# Patient Record
Sex: Male | Born: 1970 | Race: Black or African American | Hispanic: No | Marital: Married | Smoking: Never smoker
Health system: Southern US, Community
[De-identification: ages and names within clinical notes are randomized; demographics above are authoritative.]

## PROBLEM LIST (undated history)

## (undated) DIAGNOSIS — E669 Obesity, unspecified: Secondary | ICD-10-CM

## (undated) DIAGNOSIS — K8020 Calculus of gallbladder without cholecystitis without obstruction: Secondary | ICD-10-CM

## (undated) DIAGNOSIS — E7800 Pure hypercholesterolemia, unspecified: Secondary | ICD-10-CM

## (undated) DIAGNOSIS — K219 Gastro-esophageal reflux disease without esophagitis: Secondary | ICD-10-CM

## (undated) DIAGNOSIS — I10 Essential (primary) hypertension: Secondary | ICD-10-CM

## (undated) DIAGNOSIS — E559 Vitamin D deficiency, unspecified: Secondary | ICD-10-CM

## (undated) DIAGNOSIS — E119 Type 2 diabetes mellitus without complications: Secondary | ICD-10-CM

## (undated) HISTORY — PX: COLONOSCOPY: SHX174

## (undated) HISTORY — PX: FINGER SURGERY: SHX640

---

## 1998-07-15 ENCOUNTER — Emergency Department (HOSPITAL_COMMUNITY): Admission: EM | Admit: 1998-07-15 | Discharge: 1998-07-15 | Payer: Self-pay | Admitting: Emergency Medicine

## 1998-07-15 ENCOUNTER — Ambulatory Visit (HOSPITAL_BASED_OUTPATIENT_CLINIC_OR_DEPARTMENT_OTHER): Admission: RE | Admit: 1998-07-15 | Discharge: 1998-07-15 | Payer: Self-pay | Admitting: Orthopedic Surgery

## 1998-07-15 ENCOUNTER — Encounter: Payer: Self-pay | Admitting: Emergency Medicine

## 2001-05-14 ENCOUNTER — Emergency Department (HOSPITAL_COMMUNITY): Admission: EM | Admit: 2001-05-14 | Discharge: 2001-05-14 | Payer: Self-pay | Admitting: Emergency Medicine

## 2004-06-02 ENCOUNTER — Emergency Department (HOSPITAL_COMMUNITY): Admission: EM | Admit: 2004-06-02 | Discharge: 2004-06-02 | Payer: Self-pay | Admitting: Emergency Medicine

## 2006-01-01 IMAGING — CT CT PELVIS W/O CM
1 series · 16 of 32 positions shown, 20 images · IV contrast (agent unspecified)
Comparison: No prior studies.

CLINICAL DATA: Right flank pain.
 CT ABDOMEN WITHOUT CONTRAST:
TECHNIQUE: Contiguous axial CT images were obtained from the adrenal glands through the iliac crests.  No contrast was administered.
TECHNIQUE: Contiguous axial CT images were obtained from the iliac crests through the proximal femurs.
 Pelvic phleboliths are present.  No definite distal ureteral calculi are noted.  The appendix is noted, for example, on image 60 of series 3 adjacent to the cecum and does not appear inflamed.  No dilated bowel is evident.  No free pelvic fluid.  No pelvic or inguinal adenopathy is identified.

[Series 3: stone_wo 5.0 b40f st · axial · 0.64mm/px · z∈[-616,-280]mm · 16 of 94 slices shown, 20 images]
[im 7/94  soft-tissue]
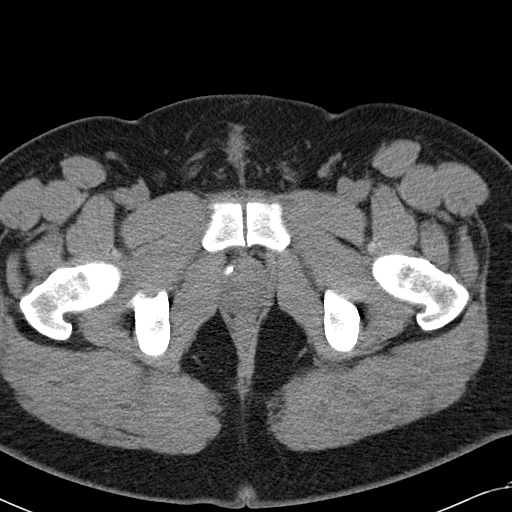
[im 7/94  bone]
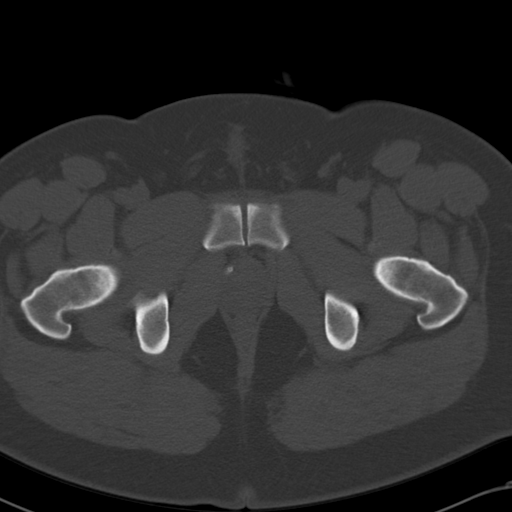
[im 13/94  soft-tissue]
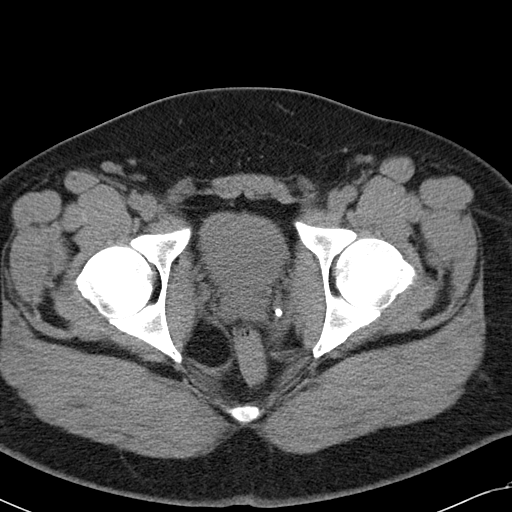
[im 19/94  soft-tissue]
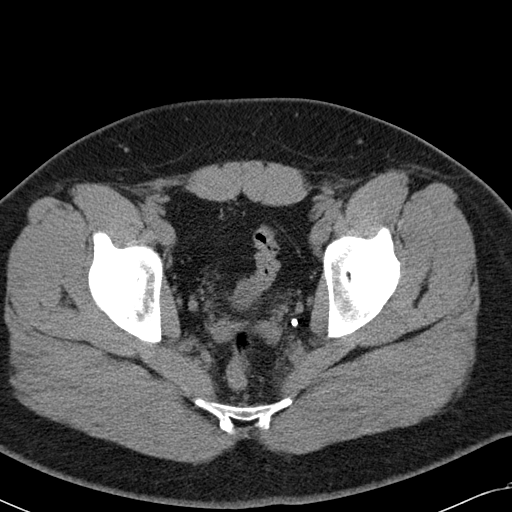
[im 25/94  soft-tissue]
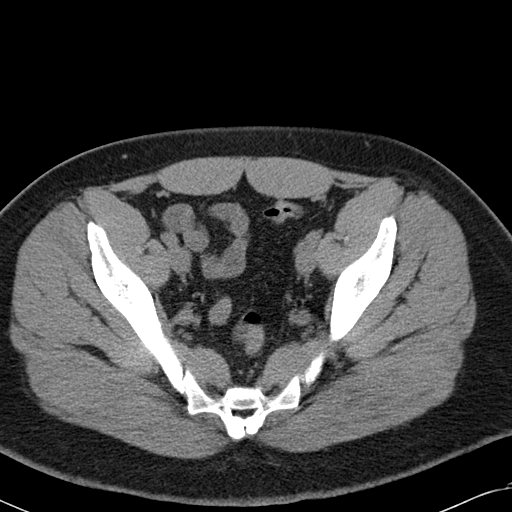
[im 31/94  soft-tissue]
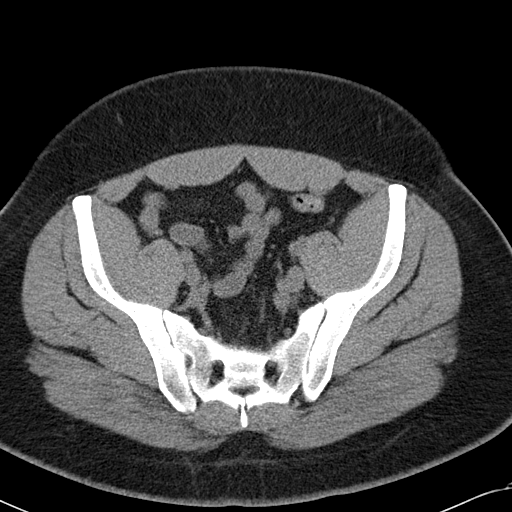
[im 37/94  soft-tissue]
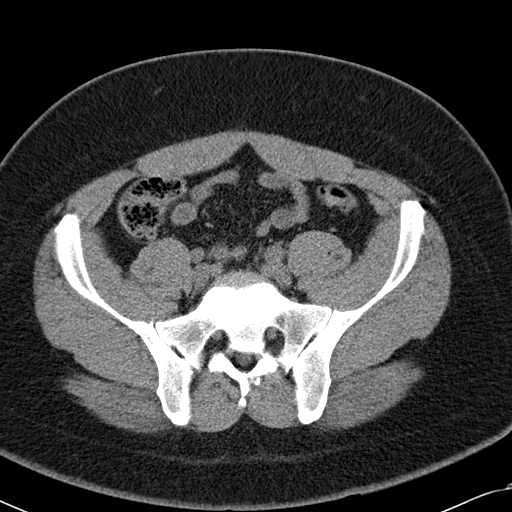
[im 43/94  soft-tissue]
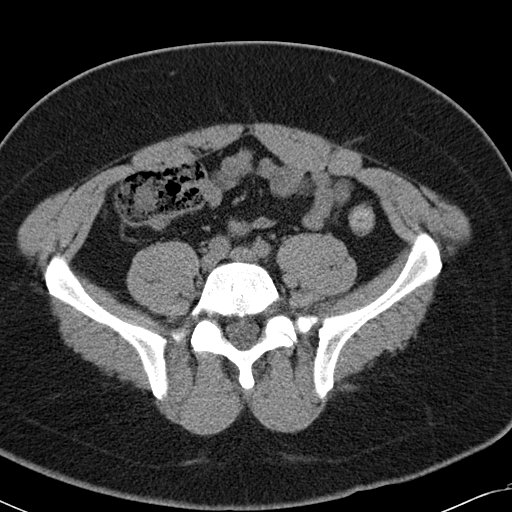
[im 52/94  soft-tissue]
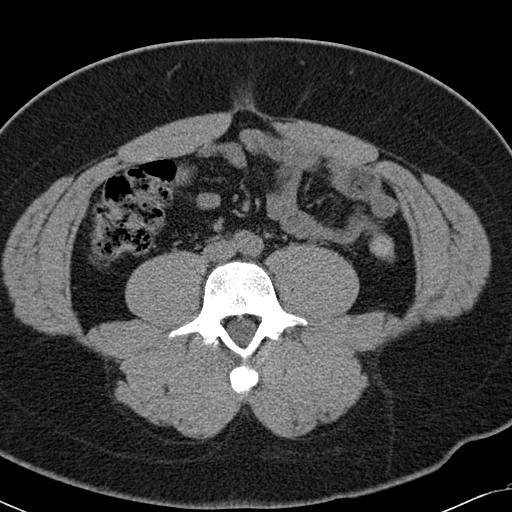
[im 58/94  soft-tissue]
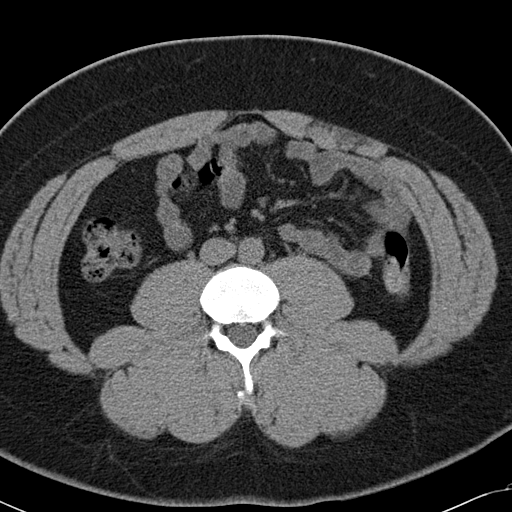
[im 58/94  bone]
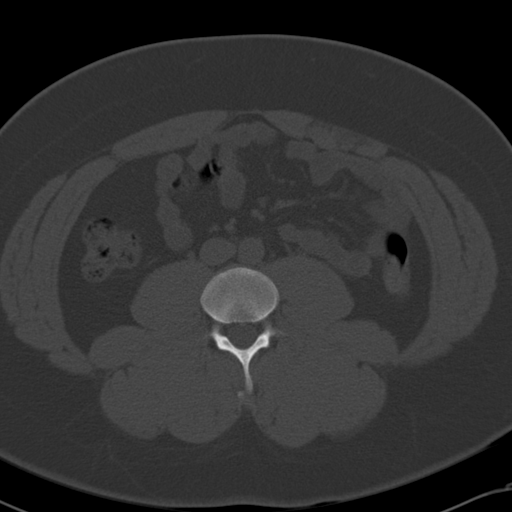
[im 64/94  soft-tissue]
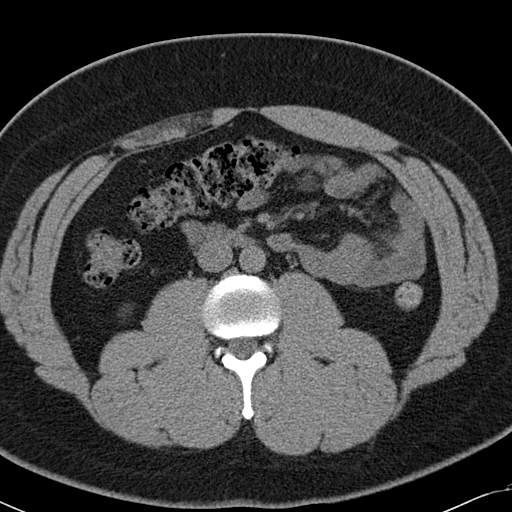
[im 70/94  soft-tissue]
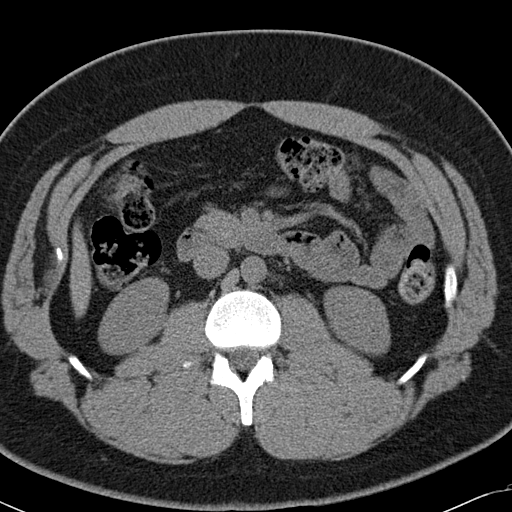
[im 76/94  soft-tissue]
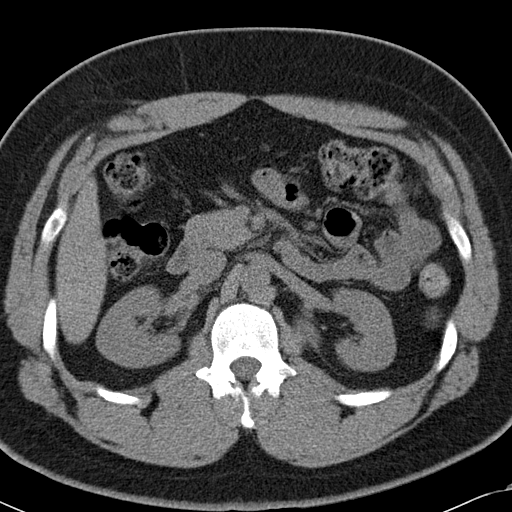
[im 82/94  soft-tissue]
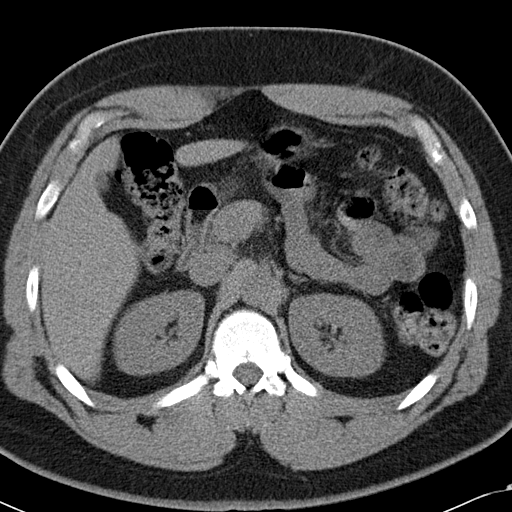
[im 82/94  lung]
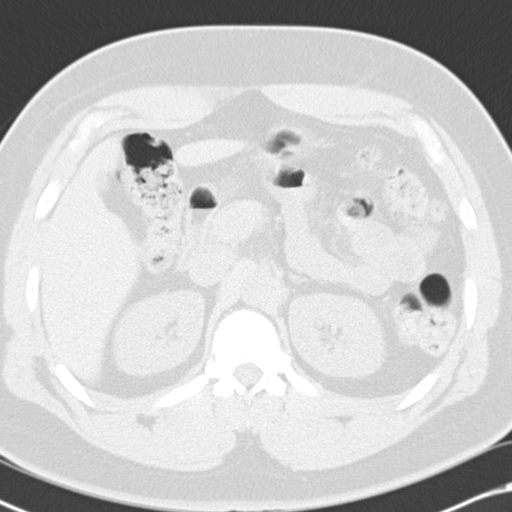
[im 85/94  lung]
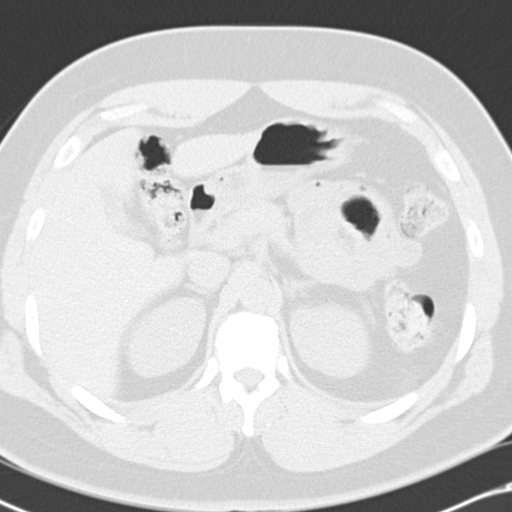
[im 88/94  soft-tissue]
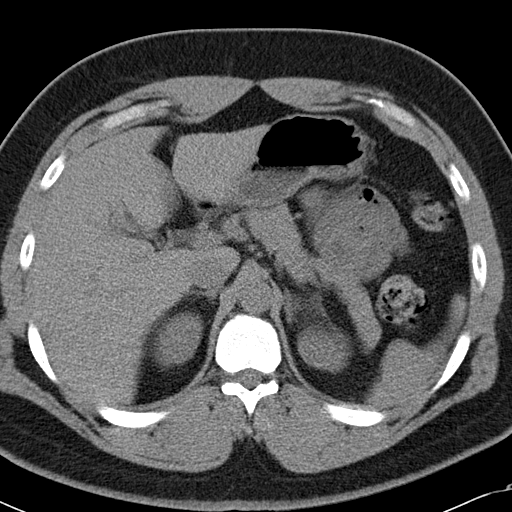
[im 88/94  lung]
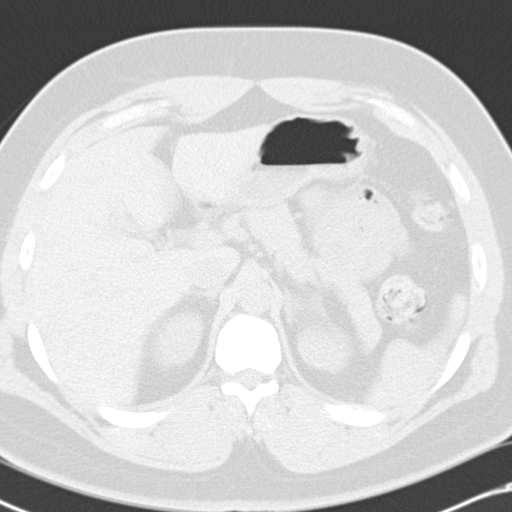
[im 91/94  lung]
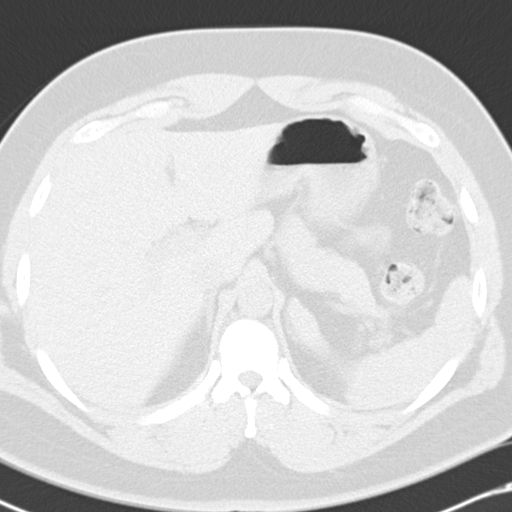

[16 of 32 positions shown; findings below may reference images not displayed]

FINDINGS: Dependent density in the gallbladder is nonspecific but could represent sludge or layering gallstones.  The adrenal glands appear unremarkable.  No renal calculi, hydronephrosis or hydroureter.  No pathologic retroperitoneal adenopathy.
IMPRESSION: No acute upper abdominal findings.  
 CT PELVIS WITHOUT CONTRAST:
IMPRESSION: No acute pelvic findings.

## 2015-08-22 ENCOUNTER — Ambulatory Visit
Admission: EM | Admit: 2015-08-22 | Discharge: 2015-08-22 | Disposition: A | Discharge status: Home / Self Care | Payer: BLUE CROSS/BLUE SHIELD | Attending: Family Medicine | Admitting: Family Medicine

## 2015-08-22 DIAGNOSIS — J309 Allergic rhinitis, unspecified: Secondary | ICD-10-CM | POA: Diagnosis not present

## 2015-08-22 DIAGNOSIS — J069 Acute upper respiratory infection, unspecified: Secondary | ICD-10-CM | POA: Diagnosis not present

## 2015-08-22 HISTORY — DX: Vitamin D deficiency, unspecified: E55.9

## 2015-08-22 LAB — RAPID STREP SCREEN (MED CTR MEBANE ONLY): Streptococcus, Group A Screen (Direct): NEGATIVE

## 2015-08-22 MED ORDER — FLUTICASONE PROPIONATE 50 MCG/ACT NA SUSP: 2.0000 | Freq: Every day | NASAL | 0 refills | Status: DC

## 2015-08-22 MED ORDER — FEXOFENADINE-PSEUDOEPHED ER 180-240 MG PO TB24: 1.0000 | ORAL_TABLET | Freq: Every day | ORAL | 0 refills | Status: DC

## 2015-08-22 NOTE — ED Triage Notes (Signed)
Patient states that he has sinus pain and pressure with dizziness and lightheaded. Patient states that symptoms started this morning when he woke up. Patient states that he went to work but, couldn't make it the whole day feeling bad.

## 2015-08-22 NOTE — ED Provider Notes (Signed)
[facility]    CSN: [id number] Arrival date & time: 08/22/15  1334  First Provider Contact:  First MD Initiated Contact with Patient 08/22/15 1518        History   Chief Complaint Chief Complaint  Patient presents with  . Sinusitis    HPI Charles Bell is a 45 y.o. male.   Patient is here for sinus infection. He woke up this morning felt dizzy and lightheaded. Which worked feel well and felt dizzy and congested. Comes in today will be evaluated for sinus infection. He does report having a sore throat as well. Unable to get the most consistent history from the patient this time as far as past history.    Sinusitis  Associated symptoms: congestion, rhinorrhea and sore throat   Associated symptoms: no ear pain, no fatigue and no fever     Past Medical History:  Diagnosis Date  . Vitamin D deficiency     There are no active problems to display for this patient.   Past Surgical History:  Procedure Laterality Date  . FINGER SURGERY Right    ring finger       Home Medications    Prior to Admission medications   Medication Sig Start Date End Date Taking? Authorizing Provider  fexofenadine-pseudoephedrine (ALLEGRA-D ALLERGY & CONGESTION) 180-240 MG 24 hr tablet Take 1 tablet by mouth daily. 08/22/15   Hassan Rowan, MD  fluticasone (FLONASE) 50 MCG/ACT nasal spray Place 2 sprays into both nostrils daily. 08/22/15   Hassan Rowan, MD    Family History History reviewed. No pertinent family history.  Social History Social History  Substance Use Topics  . Smoking status: Never Smoker  . Smokeless tobacco: Never Used  . Alcohol use No     Allergies   Review of patient's allergies indicates no known allergies.   Review of Systems Review of Systems  Constitutional: Positive for activity change. Negative for appetite change, fatigue and fever.  HENT: Positive for congestion, rhinorrhea, sinus pressure and sore throat. Negative for ear discharge, ear  pain and facial swelling.   Neurological: Positive for dizziness.  All other systems reviewed and are negative.    Physical Exam Triage Vital Signs ED Triage Vitals  Enc Vitals Group     BP 08/22/15 1416 (!) 143/72     Pulse Rate 08/22/15 1416 63     Resp 08/22/15 1416 16     Temp 08/22/15 1416 98.2 F (36.8 C)     Temp Source 08/22/15 1416 Oral     SpO2 08/22/15 1416 100 %     Weight 08/22/15 1416 256 lb (116.1 kg)     Height 08/22/15 1416  (1.778 m)     Head Circumference --      Peak Flow --      Pain Score 08/22/15 1415 4     Pain Loc --      Pain Edu? --      Excl. in GC? --    No data found.   Updated Vital Signs BP (!) 143/72 (BP Location: Left Arm)   Pulse 63   Temp 98.2 F (36.8 C) (Oral)   Resp 16   Ht  (1.778 m)   Wt 256 lb (116.1 kg)   SpO2 100%   BMI 36.73 kg/m   Visual Acuity Right Eye Distance:   Left Eye Distance:   Bilateral Distance:    Right Eye Near:   Left Eye Near:    Bilateral  Near:     Physical Exam  Constitutional: He is oriented to person, place, and time. He appears well-developed and well-nourished.  HENT:  Head: Normocephalic and atraumatic.  Right Ear: Tympanic membrane, external ear and ear canal normal.  Left Ear: Tympanic membrane, external ear and ear canal normal.  Nose: Mucosal edema present. No rhinorrhea, nose lacerations or sinus tenderness. Right sinus exhibits no maxillary sinus tenderness and no frontal sinus tenderness. Left sinus exhibits no maxillary sinus tenderness and no frontal sinus tenderness.  Mouth/Throat: No oral lesions. No dental caries. Posterior oropharyngeal erythema present.  Eyes: Conjunctivae are normal. Pupils are equal, round, and reactive to light.  Neck: Normal range of motion.  Cardiovascular: Normal rate and regular rhythm.   Pulmonary/Chest: Effort normal and breath sounds normal.  Musculoskeletal: Normal range of motion. He exhibits no deformity.  Neurological: He is alert  and oriented to person, place, and time.  Skin: Skin is warm and dry.  Psychiatric: He has a normal mood and affect.  Vitals reviewed.    UC Treatments / Results  Labs (all labs ordered are listed, but only abnormal results are displayed) Labs Reviewed  RAPID STREP SCREEN (NOT AT [facility])  CULTURE, GROUP A STREP [facility])    EKG  EKG Interpretation None       Radiology No results found.  Procedures Procedures (including critical care time)  Medications Ordered in UC Medications - No data to display   Initial Impression / Assessment and Plan / UC Course  I have reviewed the triage vital signs and the nursing notes.  Pertinent labs & imaging results that were available during my care of the patient were reviewed by me and considered in my medical decision making (see chart for details).  Clinical Course   Results for orders placed or performed during the hospital encounter of 08/22/15  Rapid strep screen  Result Value Ref Range   Streptococcus, Group A Screen (Direct) NEGATIVE NEGATIVE    At this time this appears be more viral or allergic sinusitis or URIp will place on Flonase and Allegra-D less strep test is positive. We have work note for today and tomorrow.  Final Clinical Impressions(s) / UC Diagnoses   Final diagnoses:  URI, acute  Allergic sinusitis    New Prescriptions New Prescriptions   FEXOFENADINE-PSEUDOEPHEDRINE (ALLEGRA-D ALLERGY & CONGESTION) 180-240 MG 24 HR TABLET    Take 1 tablet by mouth daily.   FLUTICASONE (FLONASE) 50 MCG/ACT NASAL SPRAY    Place 2 sprays into both nostrils daily.     Hassan Rowan, MD 08/22/15 [phone]

## 2015-08-26 LAB — CULTURE, GROUP A STREP (THRC)

## 2015-08-29 ENCOUNTER — Telehealth: Payer: Self-pay | Admitting: Emergency Medicine

## 2015-08-29 ENCOUNTER — Encounter: Payer: Self-pay | Admitting: Family Medicine

## 2015-08-29 MED ORDER — AMOXICILLIN 875 MG PO TABS: 875.0000 mg | ORAL_TABLET | Freq: Two times a day (BID) | ORAL | 0 refills | Status: AC

## 2015-08-29 NOTE — Telephone Encounter (Signed)
I have attempted twice to call patient and have left two messages on patient's home answering machine to call us back regarding his throat culture result.  Dr. Thurmond Butts has reviewed patient's chart and would like Amoxicillin 875mg  1 tablet twice a day dispense 20 with no refills sent to his pharmacy.  I will send the prescription to the pharmacy listed and will send a letter to his home notifying patient of his result and to pick up his prescription.

## 2015-09-02 ENCOUNTER — Telehealth: Payer: Self-pay

## 2015-09-02 NOTE — Telephone Encounter (Signed)
Informed patient of his test results for Strep- Negative

## 2015-10-15 ENCOUNTER — Ambulatory Visit (HOSPITAL_BASED_OUTPATIENT_CLINIC_OR_DEPARTMENT_OTHER): Payer: BLUE CROSS/BLUE SHIELD

## 2015-10-15 ENCOUNTER — Ambulatory Visit (INDEPENDENT_AMBULATORY_CARE_PROVIDER_SITE_OTHER): Payer: BLUE CROSS/BLUE SHIELD | Admitting: Podiatry

## 2015-10-15 ENCOUNTER — Encounter: Payer: Self-pay | Admitting: Podiatry

## 2015-10-15 DIAGNOSIS — M7672 Peroneal tendinitis, left leg: Secondary | ICD-10-CM | POA: Diagnosis not present

## 2015-10-15 DIAGNOSIS — R52 Pain, unspecified: Secondary | ICD-10-CM

## 2015-10-15 DIAGNOSIS — M7670 Peroneal tendinitis, unspecified leg: Secondary | ICD-10-CM | POA: Insufficient documentation

## 2015-10-15 MED ORDER — MELOXICAM 15 MG PO TABS: 15.0000 mg | ORAL_TABLET | Freq: Every day | ORAL | 2 refills | Status: DC

## 2015-10-15 NOTE — Patient Instructions (Signed)
Peroneal Tendinitis With Rehab Tendonitis is inflammation of a tendon. Inflammation of the tendons on the back of the outer ankle (peroneal tendons) is known as peroneal tendonitis. The peroneal tendons are responsible for connecting the muscles that allow you to stand on your tiptoes to the bones of the ankle. For this reason, peroneal tendonitis often causes pain when trying to complete such motions. Peroneal tendonitis often involves a tear (strain) of the peroneal tendons. Strains are classified into three categories. Grade 1 strains cause pain, but the tendon is not lengthened. Grade 2 strains include a lengthened ligament, due to the ligament being stretched or partially ruptured. With grade 2 strains there is still function, although function may be decreased. Grade 3 strains involve a complete tear of the tendon or muscle, and function is usually impaired. SYMPTOMS   Pain, tenderness, swelling, warmth, or redness over the back of the outer side of the ankle, the outer part of the mid-foot, or the bottom of the arch.  Pain that gets worse with ankle motion (especially when pushing off or pushing down with the front of the foot), or when standing on the ball of the foot or pushing the foot outward.  Crackling sound (crepitation) when the tendon is moved or touched. CAUSES  Peroneal tendinitis occurs when injury to the peroneal tendons causes the body to respond with inflammation. Common causes of injury include:  An overuse injury, in which the groove behind the outer ankle (where the tendon is located) causes wear on the tendon.  A sudden stress placed on the tendon, such as from an increase in the intensity, frequency, or duration of training.  Direct hit (trauma) to the tendon.  Return to activity too soon after a previous ankle injury. RISK INCREASES WITH:  Sports that require sudden, repetitive pushing off of the foot, such as jumping or quick starts.  Kicking and running sports,  especially running down hills or long distances.  Poor strength and flexibility.  Previous injury to the foot, ankle, or leg. PREVENTION  Warm up and stretch properly before activity.  Allow for adequate recovery between workouts.  Maintain physical fitness:  Strength, flexibility, and endurance.  Cardiovascular fitness.  Complete rehabilitation after previous injury. PROGNOSIS  If treated properly, peroneal tendonitis usually heals within 6 weeks.  RELATED COMPLICATIONS  Longer healing time, if not properly treated or if not given enough time to heal.  Recurring symptoms if activity is resumed too soon, with overuse, or when using poor technique.  If untreated, tendinitis may result in tendon rupture, requiring surgery. TREATMENT  Treatment first involves the use of ice and medicine to reduce pain and inflammation. The use of strengthening and stretching exercises may help reduce pain with activity. These exercises may be performed at unsuccessful, surgery to remove the inflamed tendon lining (sheath) may be advised.  MEDICATION   If pain medicine is needed, nonsteroidal anti-inflammatory medicines (aspirin and ibuprofen), or other minor pain relievers (acetaminophen), are often advised.  Do not take pain medicine for 7 days before surgery.  Prescription pain relievers may be given, if your caregiver thinks they are needed. Use only as directed and only as much as you need. HEAT AND COLD  Cold treatment (icing) should be applied for 10 to 15 minutes every 2 to 3 hours for inflammation and pain, and immediately after activity that aggravates your symptoms. Use ice packs or an ice massage.  Heat treatment may be used before performing stretching and strengthening activities prescribed by your   caregiver, physical therapist, or athletic trainer. Use a heat pack or a warm water soak. SEEK MEDICAL CARE IF:  Symptoms get worse or do not improve in 2 to 4 weeks, despite  treatment.  New, unexplained symptoms develop. (Drugs used in treatment may produce side effects.) EXERCISES RANGE OF MOTION (ROM) AND STRETCHING EXERCISES - Peroneal Tendinitis These exercises may help you when beginning to rehabilitate your injury. Your symptoms may resolve with or without further involvement from your physician, physical therapist or athletic trainer. While completing these exercises, remember:   Restoring tissue flexibility helps normal motion to return to the joints. This allows healthier, less painful movement and activity.  An effective stretch should be held for at least 30 seconds.  A stretch should never be painful. You should only feel a gentle lengthening or release in the stretched tissue. RANGE OF MOTION - Ankle Eversion  Sit with your right / left ankle crossed over your opposite knee.  Grip your foot with your opposite hand, placing your thumb on the top of your foot and your fingers across the bottom of your foot.  Gently push your foot downward with a slight rotation, so your littlest toes rise slightly toward the ceiling.  You should feel a gentle stretch on the inside of your ankle. Hold the stretch for __________ seconds. Repeat __________ times. Complete this exercise __________ times per day.  RANGE OF MOTION - Ankle Inversion  Sit with your right / left ankle crossed over your opposite knee.  Grip your foot with your opposite hand, placing your thumb on the bottom of your foot and your fingers across the top of your foot.  Gently pull your foot so the smallest toe comes toward you and your thumb pushes the inside of the ball of your foot away from you.  You should feel a gentle stretch on the outside of your ankle. Hold the stretch for __________ seconds. Repeat __________ times. Complete this exercise __________ times per day.  RANGE OF MOTION - Ankle Plantar Flexion  Sit with your right / left leg crossed over your opposite knee.  Use  your opposite hand to pull the top of your foot and toes toward you.  You should feel a gentle stretch on the top of your foot and ankle. Hold this position for __________ seconds. Repeat __________ times. Complete __________ times per day.  STRETCH - Gastroc, Standing  Place your hands on a wall.  Extend your right / left leg behind you, keeping the front knee somewhat bent.  Slightly point your toes inward on your back foot.  Keeping your right / left heel on the floor and your knee straight, shift your weight toward the wall, not allowing your back to arch.  You should feel a gentle stretch in the calf. Hold this position for __________ seconds. Repeat __________ times. Complete this stretch __________ times per day. STRETCH - Soleus, Standing  Place your hands on a wall.  Extend your right / left leg behind you, keeping the other knee somewhat bent.  Slightly point your toes inward on your back foot.  Keep your heel on the floor, bend your back knee, and slightly shift your weight over the back leg so that you feel a gentle stretch deep in your back calf.  Hold this position for __________ seconds. Repeat __________ times. Complete this stretch __________ times per day. STRETCH - Gastrocsoleus, Standing Note: This exercise can place a lot of stress on your foot and ankle. Please   complete this exercise only if specifically instructed by your caregiver.   Place the ball of your right / left foot on a step, keeping your other foot firmly on the same step.  Hold on to the wall or a rail for balance.  Slowly lift your other foot, allowing your body weight to press your heel down over the edge of the step.  You should feel a stretch in your right / left calf.  Hold this position for __________ seconds.  Repeat this exercise with a slight bend in your knee. Repeat __________ times. Complete this stretch __________ times per day.  STRENGTHENING EXERCISES - Peroneal Tendinitis   These exercises may help you when beginning to rehabilitate your injury. They may resolve your symptoms with or without further involvement from your physician, physical therapist or athletic trainer. While completing these exercises, remember:   Muscles can gain both the endurance and the strength needed for everyday activities through controlled exercises.  Complete these exercises as instructed by your physician, physical therapist or athletic trainer. Increase the resistance and repetitions only as guided by your caregiver. STRENGTH - Dorsiflexors  Secure a rubber exercise band or tubing to a fixed object (table, pole) and loop the other end around your right / left foot.  Sit on the floor facing the fixed object. The band should be slightly tense when your foot is relaxed.  Slowly draw your foot back toward you, using your ankle and toes.  Hold this position for __________ seconds. Slowly release the tension in the band and return your foot to the starting position. Repeat __________ times. Complete this exercise __________ times per day.  STRENGTH - Towel Curls  Sit in a chair, on a non-carpeted surface.  Place your foot on a towel, keeping your heel on the floor.  Pull the towel toward your heel only by curling your toes. Keep your heel on the floor.  If instructed by your physician, physical therapist or athletic trainer, add weight to the end of the towel. Repeat __________ times. Complete this exercise __________ times per day. STRENGTH - Ankle Eversion   Secure one end of a rubber exercise band or tubing to a fixed object (table, pole). Loop the other end around your foot, just before your toes.  Place your fists between your knees. This will focus your strengthening at your ankle.  Drawing the band across your opposite foot, away from the pole, slowly, pull your little toe out and up. Make sure the band is positioned to resist the entire motion.  Hold this position for  __________ seconds.  Have your muscles resist the band, as it slowly pulls your foot back to the starting position. Repeat __________ times. Complete this exercise __________ times per day.    This information is not intended to replace advice given to you by your health care provider. Make sure you discuss any questions you have with your health care provider.   Document Released: 01/12/2005 Document Revised: 05/29/2014 Document Reviewed: 04/26/2008 Elsevier Interactive Patient Education 2016 [employer]  

## 2015-10-15 NOTE — Progress Notes (Signed)
   Subjective:    Patient ID: Charles Bell, male    DOB: 1970-04-30, 45 y.o.   MRN: [id number]  HPI  45 year old male presents the office today for concerns of pain to the also asked for the left which been ongoing for about 3 months. He states the pain is intermittent. He does get pain after he stands for 11 hours on concrete floors at work and if he sits down through time and stands back up he gets pain in the outside aspect a points the fifth metatarsal base. He denies any recent injury or trauma. Describes as a throbbing, aching sensation. It does cause him to limp when he does have pain. Denies any swelling or redness. No recent treatment. No other complaints at this time.  Review of Systems  HENT: Positive for sinus pressure.   All other systems reviewed and are negative.      Objective:   Physical Exam General: AAO x3, NAD  Dermatological: Skin is warm, dry and supple bilateral. Nails x 10 are well manicured; remaining integument appears unremarkable at this time. There are no open sores, no preulcerative lesions, no rash or signs of infection present.  Vascular: Dorsalis Pedis artery and Posterior Tibial artery pedal pulses are 2/4 bilateral with immedate capillary fill time. There is no pain with calf compression, swelling, warmth, erythema.   Neruologic: Grossly intact via light touch bilateral. Vibratory intact via tuning fork bilateral. Protective threshold with Semmes Wienstein monofilament intact to all pedal sites bilateral.   Musculoskeletal: There is tenderness palpation to left fifth metatarsal base and along the distal portion of the peroneal tendons on the insertion. Tendons appear to be intact. There is no amount edema, erythema, increase in warmth. There is no area pinpoint bony tenderness or pain the vibratory sensation. MMT 5/5. Range of motion intact.  Gait: Unassisted, Nonantalgic.      Assessment & Plan:  45 year old male with left insertional peroneal  tendinitis -Treatment options discussed including all alternatives, risks, and complications -Etiology of symptoms were discussed -Patient declined x-rays -Discussed steroid injection which she wishes to proceed with periodic sterile conditions a mixture Dexon is an appointment seconds infiltrated around the area of maximal tenderness. Care was taken not to directly infiltrated into the peroneal tendon. Tolerated swelling, occasions. Post injection care was discussed. -Plantar fascial brace applied from medial to lateral -Prescribed mobic. Discussed side effects of the medication and directed to stop if any are to occur and call the office.  -Discussed stretching and icing daily -Shoegear modifications and orthtoics -Follow-up as scheduled or sooner if any problems arise. In the meantime, encouraged to call the office with any questions, concerns, change in symptoms.   Ovid CurdMatthew Wagoner, DPM

## 2015-11-12 ENCOUNTER — Ambulatory Visit: Payer: BLUE CROSS/BLUE SHIELD | Admitting: Podiatry

## 2017-06-26 ENCOUNTER — Emergency Department: Payer: BLUE CROSS/BLUE SHIELD

## 2017-06-26 ENCOUNTER — Encounter: Payer: Self-pay | Admitting: Emergency Medicine

## 2017-06-26 ENCOUNTER — Emergency Department
Admission: EM | Admit: 2017-06-26 | Discharge: 2017-06-26 | Disposition: A | Discharge status: Home / Self Care | Payer: BLUE CROSS/BLUE SHIELD | Attending: Emergency Medicine | Admitting: Emergency Medicine

## 2017-06-26 ENCOUNTER — Other Ambulatory Visit: Payer: Self-pay

## 2017-06-26 DIAGNOSIS — K8020 Calculus of gallbladder without cholecystitis without obstruction: Secondary | ICD-10-CM | POA: Diagnosis not present

## 2017-06-26 DIAGNOSIS — R1010 Upper abdominal pain, unspecified: Secondary | ICD-10-CM

## 2017-06-26 DIAGNOSIS — K8050 Calculus of bile duct without cholangitis or cholecystitis without obstruction: Secondary | ICD-10-CM

## 2017-06-26 LAB — COMPREHENSIVE METABOLIC PANEL
ALT: 34 U/L (ref 17–63)
ANION GAP: 12 (ref 5–15)
AST: 39 U/L (ref 15–41)
Albumin: 4.2 g/dL (ref 3.5–5.0)
Alkaline Phosphatase: 67 U/L (ref 38–126)
BUN: 13 mg/dL (ref 6–20)
CHLORIDE: 102 mmol/L (ref 101–111)
CO2: 24 mmol/L (ref 22–32)
CREATININE: 1.07 mg/dL (ref 0.61–1.24)
Calcium: 9.7 mg/dL (ref 8.9–10.3)
Glucose, Bld: 123 mg/dL — ABNORMAL HIGH (ref 65–99)
POTASSIUM: 4.6 mmol/L (ref 3.5–5.1)
SODIUM: 138 mmol/L (ref 135–145)
Total Bilirubin: 0.5 mg/dL (ref 0.3–1.2)
Total Protein: 7.8 g/dL (ref 6.5–8.1)

## 2017-06-26 LAB — CBC
HCT: 46.7 % (ref 40.0–52.0)
HEMOGLOBIN: 16 g/dL (ref 13.0–18.0)
MCH: 29.4 pg (ref 26.0–34.0)
MCHC: 34.3 g/dL (ref 32.0–36.0)
MCV: 85.7 fL (ref 80.0–100.0)
PLATELETS: 251 10*3/uL (ref 150–440)
RBC: 5.45 MIL/uL (ref 4.40–5.90)
RDW: 13.8 % (ref 11.5–14.5)
WBC: 7.4 10*3/uL (ref 3.8–10.6)

## 2017-06-26 LAB — URINALYSIS, COMPLETE (UACMP) WITH MICROSCOPIC
BACTERIA UA: NONE SEEN
BILIRUBIN URINE: NEGATIVE
Glucose, UA: NEGATIVE mg/dL
HGB URINE DIPSTICK: NEGATIVE
KETONES UR: NEGATIVE mg/dL
LEUKOCYTES UA: NEGATIVE
NITRITE: NEGATIVE
PH: 6 (ref 5.0–8.0)
Protein, ur: NEGATIVE mg/dL
SPECIFIC GRAVITY, URINE: 1.03 (ref 1.005–1.030)
Squamous Epithelial / LPF: NONE SEEN (ref 0–5)

## 2017-06-26 LAB — LIPASE, BLOOD: LIPASE: 31 U/L (ref 11–51)

## 2017-06-26 LAB — TROPONIN I: Troponin I: <0.03 ng/mL (ref <0.03)

## 2017-06-26 MED ORDER — IOPAMIDOL (ISOVUE-300) INJECTION 61%
100.0000 mL | Freq: Once | INTRAVENOUS | Status: AC | PRN
  Administered 2017-06-26: 100 mL via INTRAVENOUS

## 2017-06-26 MED ORDER — ONDANSETRON HCL 4 MG/2ML IJ SOLN
INTRAMUSCULAR | Status: AC
  Filled 2017-06-26: qty 2

## 2017-06-26 MED ORDER — HYDROMORPHONE HCL 1 MG/ML IJ SOLN
INTRAMUSCULAR | Status: AC
  Filled 2017-06-26: qty 1

## 2017-06-26 MED ORDER — MORPHINE SULFATE (PF) 4 MG/ML IV SOLN
4.0000 mg | Freq: Once | INTRAVENOUS | Status: DC
  Filled 2017-06-26: qty 1

## 2017-06-26 MED ORDER — SODIUM CHLORIDE 0.9 % IV BOLUS
1000.0000 mL | Freq: Once | INTRAVENOUS | Status: AC
  Administered 2017-06-26: 1000 mL via INTRAVENOUS

## 2017-06-26 MED ORDER — ONDANSETRON 4 MG PO TBDP: 4.0000 mg | ORAL_TABLET | Freq: Three times a day (TID) | ORAL | 0 refills | Status: DC | PRN

## 2017-06-26 MED ORDER — KETOROLAC TROMETHAMINE 30 MG/ML IJ SOLN
30.0000 mg | Freq: Once | INTRAMUSCULAR | Status: AC
  Administered 2017-06-26: 30 mg via INTRAVENOUS
  Filled 2017-06-26: qty 1

## 2017-06-26 MED ORDER — IOPAMIDOL (ISOVUE-300) INJECTION 61%
30.0000 mL | Freq: Once | INTRAVENOUS | Status: AC | PRN
  Administered 2017-06-26: 30 mL via ORAL

## 2017-06-26 MED ORDER — HYDROCODONE-ACETAMINOPHEN 5-325 MG PO TABS: 1.0000 | ORAL_TABLET | ORAL | 0 refills | Status: DC | PRN

## 2017-06-26 MED ORDER — ONDANSETRON HCL 4 MG/2ML IJ SOLN
4.0000 mg | Freq: Once | INTRAMUSCULAR | Status: AC
  Administered 2017-06-26: 4 mg via INTRAVENOUS
  Filled 2017-06-26: qty 2

## 2017-06-26 MED ORDER — FENTANYL CITRATE (PF) 100 MCG/2ML IJ SOLN
50.0000 ug | Freq: Once | INTRAMUSCULAR | Status: AC
  Administered 2017-06-26: 50 ug via INTRAVENOUS
  Filled 2017-06-26: qty 2

## 2017-06-26 NOTE — ED Triage Notes (Signed)
Epigastric pain x 1 hour. Nausea with emesis x 1. Denies fever.

## 2017-06-26 NOTE — ED Notes (Signed)
Patient transported to CT 

## 2017-06-26 NOTE — ED Provider Notes (Signed)
[facility] Emergency Department Provider Note  Time seen: 6:50 PM  I have reviewed the triage vital signs and the nursing notes.   HISTORY  Chief Complaint Abdominal Pain    HPI Charles Bell is a 47 y.o. male with no significant past medical history who presents to the emergency department for abdominal pain nausea vomiting.  According to the patient he woke this morning with upper and left-sided abdominal pain, nausea and has been vomiting.  Denies any diarrhea.  Denies any fever.  Largely negative review of systems otherwise.  Describes the pain is moderate dull aching type pain across the entire upper abdomen and left side.   Past Medical History:  Diagnosis Date  . Vitamin D deficiency     Patient Active Problem List   Diagnosis Date Noted  . Peroneal tendonitis 10/15/2015    Past Surgical History:  Procedure Laterality Date  . FINGER SURGERY Right    ring finger    Prior to Admission medications   Medication Sig Start Date End Date Taking? Authorizing Provider  fexofenadine-pseudoephedrine (ALLEGRA-D ALLERGY & CONGESTION) 180-240 MG 24 hr tablet Take 1 tablet by mouth daily. 08/22/15   Hassan Rowan, MD  fluticasone (FLONASE) 50 MCG/ACT nasal spray Place 2 sprays into both nostrils daily. 08/22/15   Hassan Rowan, MD  meloxicam (MOBIC) 15 MG tablet Take 1 tablet (15 mg total) by mouth daily. 10/15/15   Vivi Barrack, DPM    No Known Allergies  No family history on file.  Social History Social History   Tobacco Use  . Smoking status: Never Smoker  . Smokeless tobacco: Never Used  Substance Use Topics  . Alcohol use: No  . Drug use: No    Review of Systems Constitutional: Negative for fever. Eyes: Negative for visual complaints ENT: Negative for recent illness/congestion Cardiovascular: Negative for chest pain. Respiratory: Negative for shortness of breath. Gastrointestinal: Left-sided and upper abdominal pain.  Negative for  diarrhea.  Positive for nausea vomiting. Genitourinary: Negative for dysuria hematuria. Musculoskeletal: Negative for musculoskeletal complaints Skin: Negative for skin complaints  Neurological: Negative for headache All other ROS negative  ____________________________________________   PHYSICAL EXAM:  VITAL SIGNS: ED Triage Vitals  Enc Vitals Group     BP 06/26/17 1748 (!) 169/85     Pulse Rate 06/26/17 1748 (!) 56     Resp 06/26/17 1748 18     Temp 06/26/17 1748 98.1 F (36.7 C)     Temp Source 06/26/17 1748 Oral     SpO2 06/26/17 1748 100 %     Weight 06/26/17 1749 256 lb (116.1 kg)     Height 06/26/17 1749 5\' 10"  (1.778 m)     Head Circumference --      Peak Flow --      Pain Score --      Pain Loc --      Pain Edu? --      Excl. in GC? --     Constitutional: Alert and oriented. Well appearing and in no distress. Eyes: Normal exam ENT   Head: Normocephalic and atraumatic.   Mouth/Throat: Mucous membranes are moist. Cardiovascular: Normal rate, regular rhythm. No murmur Respiratory: Normal respiratory effort without tachypnea nor retractions. Breath sounds are clear Gastrointestinal: Soft, mild epigastric tenderness, moderate left upper quadrant left mid and left lower quadrant tenderness.  No rebound or guarding.  No distention. Musculoskeletal: Nontender with normal range of motion in all extremities.  Neurologic:  Normal speech and language.  No gross focal neurologic deficits Skin:  Skin is warm, dry and intact.  Psychiatric: Mood and affect are normal. Speech and behavior are normal.   ____________________________________________    EKG  EKG reviewed and interpreted by myself shows normal sinus rhythm at 53 bpm with a narrow QRS, normal axis, normal intervals, no concerning ST changes.  ____________________________________________    RADIOLOGY  CT scan shows no acute abnormalities, gallstones but no acute cholecystitis.  Ultrasound shows  gallstones but no signs of acute cholecystitis.  ____________________________________________   INITIAL IMPRESSION / ASSESSMENT AND PLAN / ED COURSE  Pertinent labs & imaging results that were available during my care of the patient were reviewed by me and considered in my medical decision making (see chart for details).  Patient presents to the emergency department for nausea vomiting and abdominal pain.  Differential would include gastritis, gastroenteritis, peptic or gastric ulcers, pancreatitis, colitis or diverticulitis.  We will obtain labs, CT scan, and treat nausea, IV hydrate.  Patient does not wish for any pain medication at this time.  CT scan pending, labs are resulted largely within normal limits, urinalysis pending.  Labs are largely within normal limits.  CT and ultrasound are consistent with gallstones but no acute cholecystitis, no other concerning findings.  Common bile duct is 6.5.  I discussed the findings with Dr. Earlene Plateravis of general surgery.  Recommends attempted pain control.  Patient had been refusing pain medication until now, remains nauseated continues with moderate to significant upper abdominal pain.  States mostly in the epigastrium at this time.  We will dose fentanyl and Toradol and reassess.   Patient states his pain is nearly gone rates it as a 1/10.  States he wishes to try to go home.  We will discharge with pain and nausea medication as well as general surgery follow-up.  Patient agreeable to plan of care. ____________________________________________   FINAL CLINICAL IMPRESSION(S) / ED DIAGNOSES  Upper abdominal pain Nausea vomiting    Minna AntisPaduchowski, Clide Remmers, MD 06/26/17 2235

## 2017-06-26 NOTE — ED Notes (Signed)
Pt refused IV Morphine at this time. Call bell in reach. Pt to call if pain reaches intolerable level.

## 2017-06-26 NOTE — Discharge Instructions (Addendum)
Please take your pain and nausea medication as needed, but only as prescribed.  Please call the number provided for general surgery follow-up on Monday morning to arrange a follow-up appointment as soon as possible.  Let them know you are seen in the emergency department.  Return to the emergency department for any acute worsening of pain or development of fever.

## 2017-06-26 NOTE — ED Notes (Addendum)
Call to CT; pt only able to drink 1 bottle - feels very full; EDP good with only one bottle, CT called for pt ready

## 2017-07-06 ENCOUNTER — Ambulatory Visit (INDEPENDENT_AMBULATORY_CARE_PROVIDER_SITE_OTHER): Payer: BLUE CROSS/BLUE SHIELD | Admitting: Surgery

## 2017-07-06 ENCOUNTER — Encounter: Payer: Self-pay | Admitting: Surgery

## 2017-07-06 VITALS — BP 149/78 | HR 77 | Temp 98.4°F | Ht 70.0 in | Wt 249.5 lb

## 2017-07-06 DIAGNOSIS — K8020 Calculus of gallbladder without cholecystitis without obstruction: Secondary | ICD-10-CM | POA: Diagnosis not present

## 2017-07-06 NOTE — Patient Instructions (Addendum)
Please give us a call in case you have any questions or concerns.   Cholelithiasis Cholelithiasis is also called "gallstones." It is a kind of gallbladder disease. The gallbladder is an organ that stores a liquid (bile) that helps you digest fat. Gallstones may not cause symptoms (may be silent gallstones) until they cause a blockage, and then they can cause pain (gallbladder attack). Follow these instructions at home:  Take over-the-counter and prescription medicines only as told by your doctor.  Stay at a healthy weight.  Eat healthy foods. This includes: ? Eating fewer fatty foods, like fried foods. ? Eating fewer refined carbs (refined carbohydrates). Refined carbs are breads and grains that are highly processed, like white bread and white rice. Instead, choose whole grains like whole-wheat bread and brown rice. ? Eating more fiber. Almonds, fresh fruit, and beans are healthy sources of fiber.  Keep all follow-up visits as told by your doctor. This is important. Contact a doctor if:  You have sudden pain in the upper right side of your belly (abdomen). Pain might spread to your right shoulder or your chest. This may be a sign of a gallbladder attack.  You feel sick to your stomach (are nauseous).  You throw up (vomit).  You have been diagnosed with gallstones that have no symptoms and you get: ? Belly pain. ? Discomfort, burning, or fullness in the upper part of your belly (indigestion). Get help right away if:  You have sudden pain in the upper right side of your belly, and it lasts for more than 2 hours.  You have belly pain that lasts for more than 5 hours.  You have a fever or chills.  You keep feeling sick to your stomach or you keep throwing up.  Your skin or the whites of your eyes turn yellow (jaundice).  You have dark-colored pee (urine).  You have light-colored poop (stool). Summary  Cholelithiasis is also called "gallstones."  The gallbladder is an organ  that stores a liquid (bile) that helps you digest fat.  Silent gallstones are gallstones that do not cause symptoms.  A gallbladder attack may cause sudden pain in the upper right side of your belly. Pain might spread to your right shoulder or your chest. If this happens, contact your doctor.  If you have sudden pain in the upper right side of your belly that lasts for more than 2 hours, get help right away. This information is not intended to replace advice given to you by your health care provider. Make sure you discuss any questions you have with your health care provider. Document Released: 07/01/2007 Document Revised: 09/29/2015 Document Reviewed: 09/29/2015 Elsevier Interactive Patient Education  2017 [employer]

## 2017-07-06 NOTE — Progress Notes (Signed)
Surgical Clinic History and Physical  Referring provider:  No referring provider defined for this encounter.  HISTORY OF PRESENT ILLNESS (HPI):  47 y.o. Bell presents for evaluation of recently diagnosed gallstones. Patient reports he 2 weeks ago developed severe epigastric abdominal pain around 8:30 am, 1 1/2 hours after awaking and before he'd eaten anything. He describes that he had eaten sushi between 11 pm - midnight the night prior and initially presented to urgent care facility for evaluation, who suspected his abdominal pain was secondary to food poisoning and prescribed anti-nausea medication. When patient's pain did not improve, he then presented to [facility] ED for further evaluation where CT and subsequent RUQ abdominal ultrasound were performed. Patient also says he prior to this episode was frequently eating fast food such as hamburgers and fried chicken for lunch while working and never previously experienced any abdominal pain -- RUQ, epigastric, or otherwise -- after eating such foods. He also denies any fever/chills, N/V, CP, or SOB. Since being told by [facility] ED that his pain could be attributable to his gallstones, he has stopped eating fast food and has been eating primarily seafood, which was what he ate prior to onset of his epigastric abdominal pain, with no further recurrence of his symptoms.  PAST MEDICAL HISTORY (PMH):  Past Medical History:  Diagnosis Date  . Vitamin D deficiency      PAST SURGICAL HISTORY (PSH):  Past Surgical History:  Procedure Laterality Date  . FINGER SURGERY Right    ring finger     MEDICATIONS:  Prior to Admission medications   Medication Sig Start Date End Date Taking? Authorizing Provider  diphenhydrAMINE (BENADRYL) 25 MG tablet Take 100 mg by mouth daily.   Yes [provider]  famotidine (PEPCID) 20 MG tablet Take 20 mg by mouth 2 (two) times daily.   Yes [provider]  fexofenadine-pseudoephedrine (ALLEGRA-D ALLERGY &  CONGESTION) 180-240 MG 24 hr tablet Take 1 tablet by mouth daily. 08/22/15  Yes Hassan Rowan, MD  fluticasone [facility]) 50 MCG/ACT nasal spray Place 2 sprays into both nostrils daily. 08/22/15  Yes Hassan Rowan, MD  meloxicam (MOBIC) 15 MG tablet Take 1 tablet (15 mg total) by mouth daily. 10/15/15  Yes Vivi Barrack, DPM  ondansetron (ZOFRAN ODT) 4 MG disintegrating tablet Take 1 tablet (4 mg total) by mouth every 8 (eight) hours as needed for nausea or vomiting. 06/26/17  Yes Minna Antis, MD  valACYclovir (VALTREX) 1000 MG tablet Take 1,000 mg by mouth daily as needed. 06/28/17  Yes [provider]     ALLERGIES:  No Known Allergies   SOCIAL HISTORY:  Social History   Socioeconomic History  . Marital status: Single    Spouse name: Not on file  . Number of children: Not on file  . Years of education: Not on file  . Highest education level: Not on file  Occupational History  . Not on file  Social Needs  . Financial resource strain: Not on file  . Food insecurity:    Worry: Not on file    Inability: Not on file  . Transportation needs:    Medical: Not on file    Non-medical: Not on file  Tobacco Use  . Smoking status: Never Smoker  . Smokeless tobacco: Never Used  Substance and Sexual Activity  . Alcohol use: No  . Drug use: No  . Sexual activity: Not on file  Lifestyle  . Physical activity:    Days per week: Not on  file    Minutes per session: Not on file  . Stress: Not on file  Relationships  . Social connections:    Talks on phone: Not on file    Gets together: Not on file    Attends religious service: Not on file    Active member of club or organization: Not on file    Attends meetings of clubs or organizations: Not on file    Relationship status: Not on file  . Intimate partner violence:    Fear of current or ex partner: Not on file    Emotionally abused: Not on file    Physically abused: Not on file    Forced sexual activity: Not on file  Other  Topics Concern  . Not on file  Social History Narrative  . Not on file    The patient currently resides (home / rehab facility / nursing home): Home The patient normally is (ambulatory / bedbound): Ambulatory  FAMILY HISTORY:  Family History  Problem Relation Age of Onset  . Cancer Maternal Aunt   . Prostate cancer Paternal Uncle   . Lung cancer Paternal Uncle   . Diabetes Paternal Uncle     Otherwise negative/non-contributory.  REVIEW OF SYSTEMS:  Constitutional: denies any other weight loss, fever, chills, or sweats  Eyes: denies any other vision changes, history of eye injury  ENT: denies sore throat, hearing problems  Respiratory: denies shortness of breath, wheezing  Cardiovascular: denies chest pain, palpitations  Gastrointestinal: abdominal pain, N/V, and bowel function as per HPI Musculoskeletal: denies any other joint pains or cramps  Skin: Denies any other rashes or skin discolorations Neurological: denies any other headache, dizziness, weakness  Psychiatric: Denies any other depression, anxiety   All other review of systems were otherwise negative   VITAL SIGNS:  BP (!) 149/78   Pulse 77   Temp 98.4 F (36.9 C) (Oral)   Ht 5\' 10"  (1.778 m)   Wt 249 lb 8 oz (113.2 kg)   BMI 35.80 kg/m   PHYSICAL EXAM:  Constitutional:  -- Obese body habitus  -- Awake, alert, and oriented x3  Eyes:  -- Pupils equally round and reactive to light  -- No scleral icterus  Ear, nose, throat:  -- No jugular venous distension -- No nasal drainage, bleeding Pulmonary:  -- No crackles  -- Equal breath sounds bilaterally -- Breathing non-labored at rest Cardiovascular:  -- S1, S2 present  -- No pericardial rubs  Gastrointestinal:  -- Abdomen soft, nontender, non-distended, no guarding/rebound  -- No abdominal masses appreciated, pulsatile or otherwise  Musculoskeletal and Integumentary:  -- Wounds or skin discoloration: None appreciated -- Extremities: B/L UE and LE  FROM, hands and feet warm  Neurologic:  -- Motor function: Intact and symmetric -- Sensation: Intact and symmetric  Labs:  CBC Latest Ref Rng & Units 06/26/2017  WBC 3.8 - 10.6 K/uL 7.4  Hemoglobin 13.0 - 18.0 g/dL 16.1  Hematocrit 09.6 - 52.0 % 46.7  Platelets 150 - 440 K/uL 251   CMP Latest Ref Rng & Units 06/26/2017  Glucose 65 - 99 mg/dL 045(W)  BUN 6 - 20 mg/dL 13  Creatinine 0.98 - 1.19 mg/dL 1.47  Sodium 829 - 562 mmol/L 138  Potassium 3.5 - 5.1 mmol/L 4.6  Chloride 101 - 111 mmol/L 102  CO2 22 - 32 mmol/L 24  Calcium 8.9 - 10.3 mg/dL 9.7  Total Protein 6.5 - 8.1 g/dL 7.8  Total Bilirubin 0.3 - 1.2 mg/dL 0.5  Alkaline  Phos 38 - 126 U/L 67  AST 15 - 41 U/L 39  ALT 17 - 63 U/L 34   Imaging studies:  Limited RUQ Abdominal Ultrasound (06/26/2017) Gallbladder is decompressed with multiple gallstones within. Common bile duct diameter measures 6.5 mm.  Assessment/Plan:  47 y.o. Bell with likely asymptomatic cholelithiasis and what seems to be a single likely unrelated episode of epigastric pain of unclear etiology, complicated by co-morbidities including obesity (BMI 36) and vitamin D deficiency.   - results of patient's imaging studies discussed in context of his symptoms  - heart healthy diet advised along with a log of foods associated with any future abdominal symptoms  - if develops post-prandial RUQ or epigastric abdominal pain after eating a particular type of food, discontinue that type of food (such as meats, cheeses/dairy, and fried foods as would be associated with cholelithiasis) and call office or present to ED  - currently no indication for surgical intervention at this time, instructed to call if any questions or concerns  - return to clinic as needed  All of the above recommendations were discussed with the patient, and all of patient's questions were answered to his expressed satisfaction.  Thank you for the opportunity to participate in this patient's  care.  -- Scherrie GerlachJason E. Earlene Plateravis, MD, RPVI : [facility] General Surgery - Partnering for exceptional care. Office: [phone]

## 2017-07-07 ENCOUNTER — Telehealth: Payer: Self-pay

## 2017-07-07 ENCOUNTER — Encounter: Payer: Self-pay | Admitting: Surgery

## 2017-07-07 NOTE — Telephone Encounter (Signed)
Spoke with Patients wife Charles Bell. She is very concerned that her husband was told he did not need to have his gall bladder removed. While in the emergency room she was told by the ER doctor that it needed to be removed due to the multiple gallstones he had.  She would like very much for Dr.Davis to call and speak with her personally as she finds the information that was given to her husband unacceptable.   I let her know that I would give Dr.Davis the message this afternoon as he is in clinic later today.  Charles Bell can be reached @ [phone].

## 2017-07-07 NOTE — Telephone Encounter (Signed)
Discussed with patient's wife, Herbert SetaHeather [phone]), the symptoms patient described yesterday, which prompted his abdominal CT and ultrasound [facility] ED, 6/1) that demonstrated cholelithiasis. Patient's wife confirmed patient's reported symptoms/experience, and the signs and symptoms of symptomatic cholelithiasis were also discussed along with indications for cholecystectomy. All of patient's wife's questions were answered clearly and concisely to her expressed satisfaction, and yesterday's recommendations for patient to record what foods appear to be associated with any future abdominal pain/symptoms and to follow up as needed were communicated. Patient's wife expressed understanding and satisfaction with the communicated recommendations.  -- Scherrie GerlachJason E. Earlene Plateravis, MD, RPVI [place]: [facility] General Surgery - Partnering for exceptional care. Office: [phone]

## 2019-01-25 IMAGING — US US ABDOMEN LIMITED
1 series · 14 of 25 positions shown · non-contrast
Comparison: CT from earlier in the same day.

CLINICAL DATA: Upper abdominal pain

EXAM:
ULTRASOUND ABDOMEN LIMITED RIGHT UPPER QUADRANT

[Series 1: us abdomen limited · 14 of 65 slices shown]
[im 1/65]
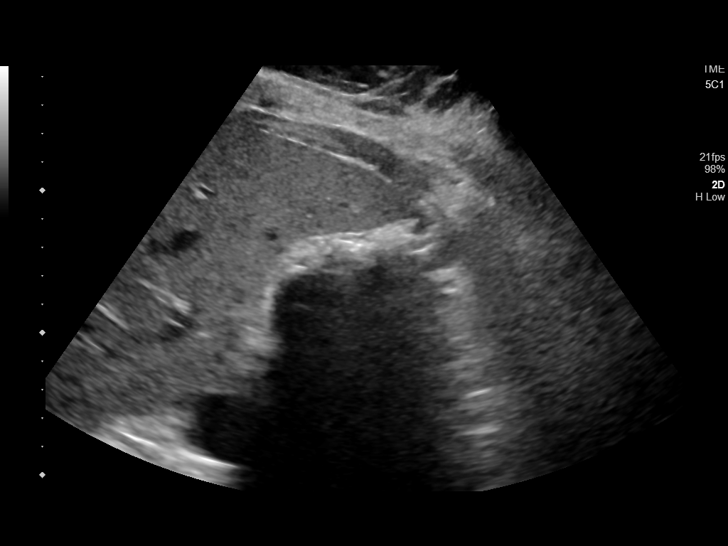
[im 6/65]
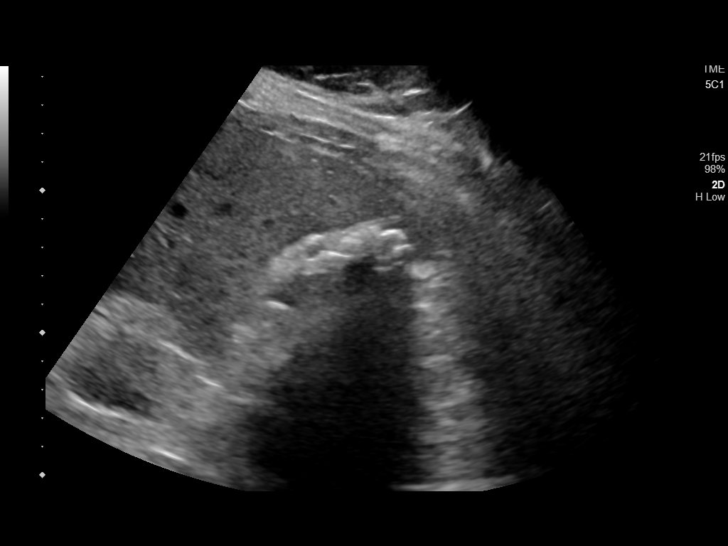
[im 11/65]
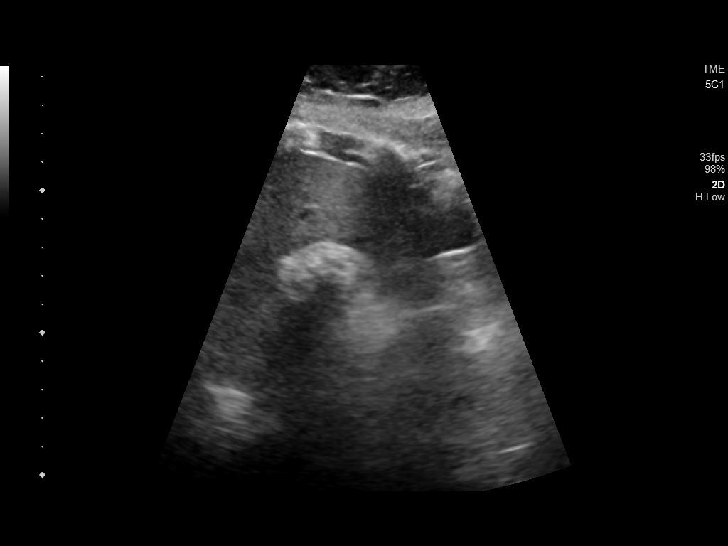
[im 17/65]
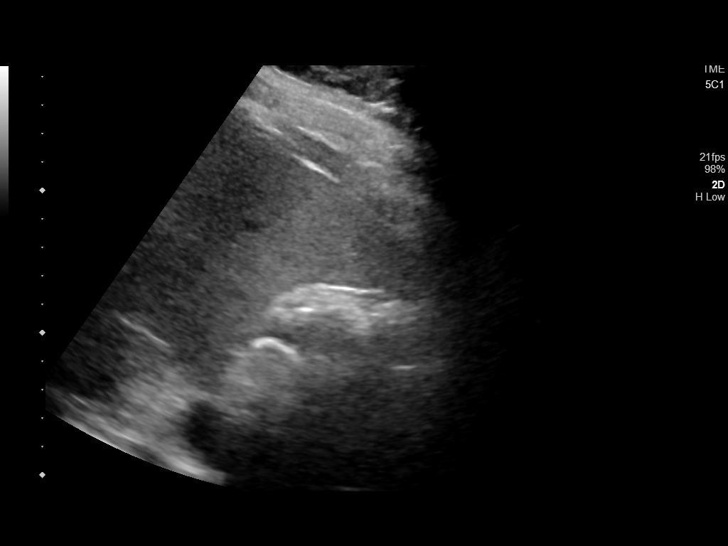
[im 22/65]
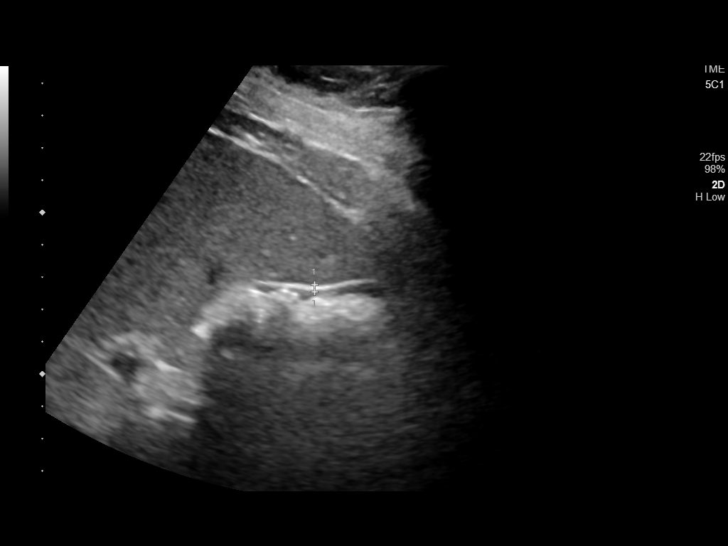
[im 25/65]
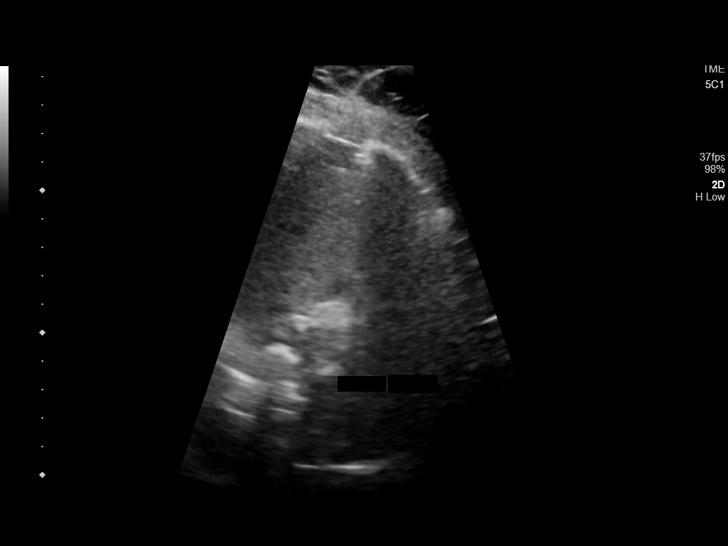
[im 30/65]
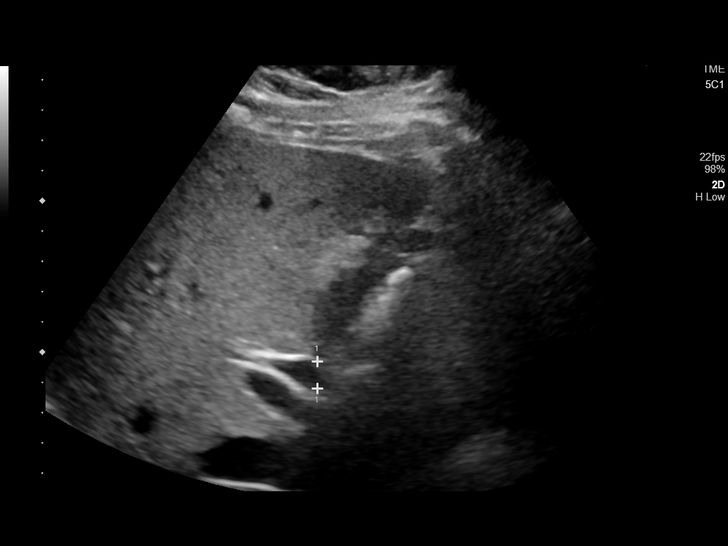
[im 35/65]
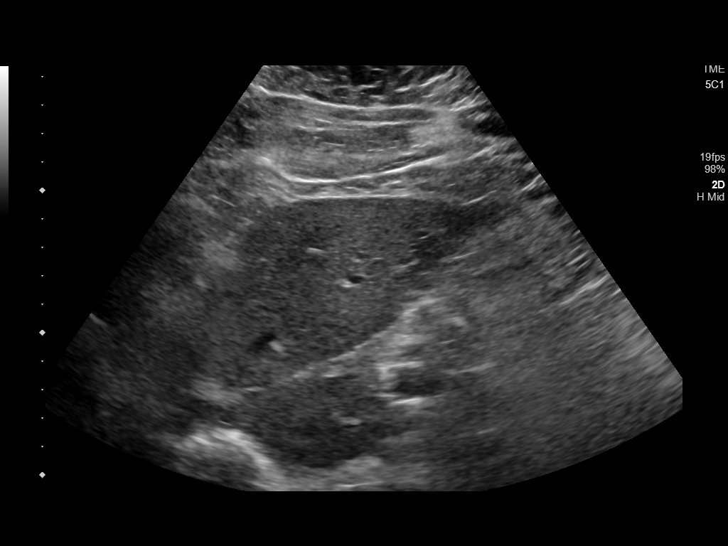
[im 41/65]
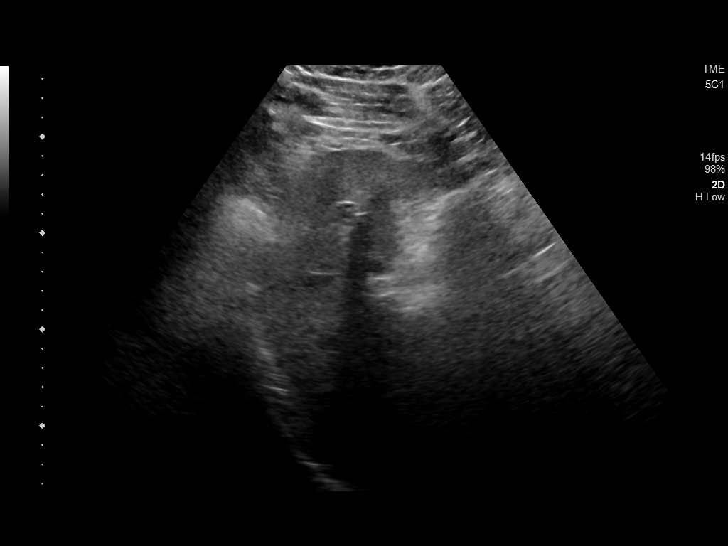
[im 43/65]
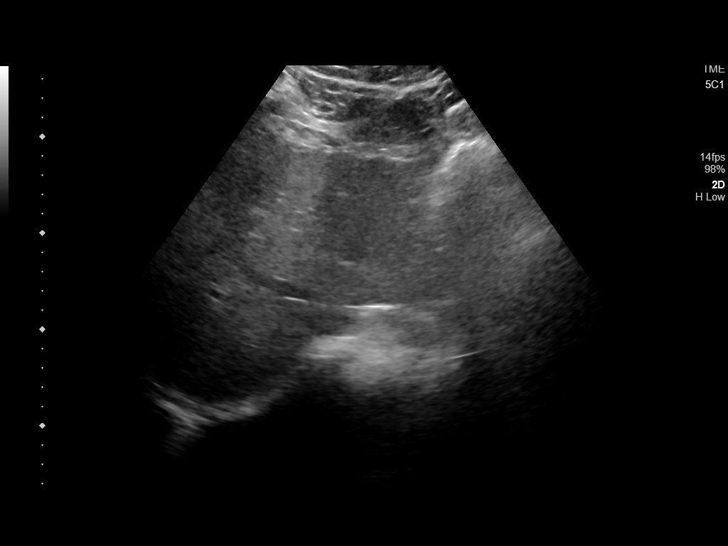
[im 49/65]
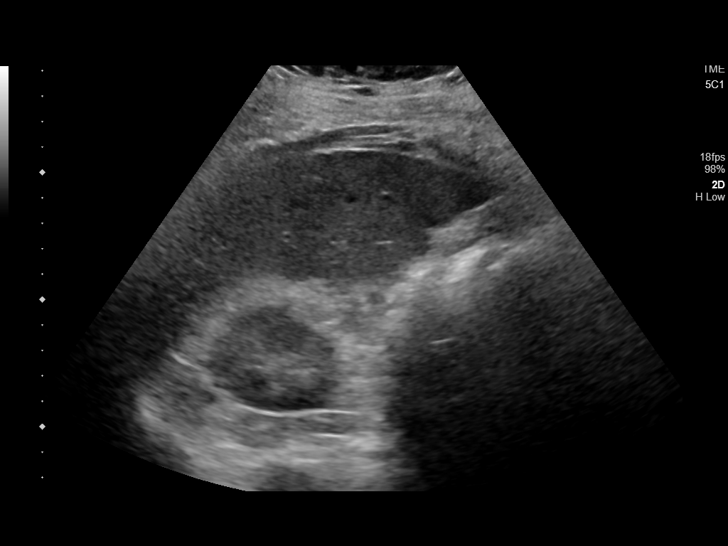
[im 54/65]
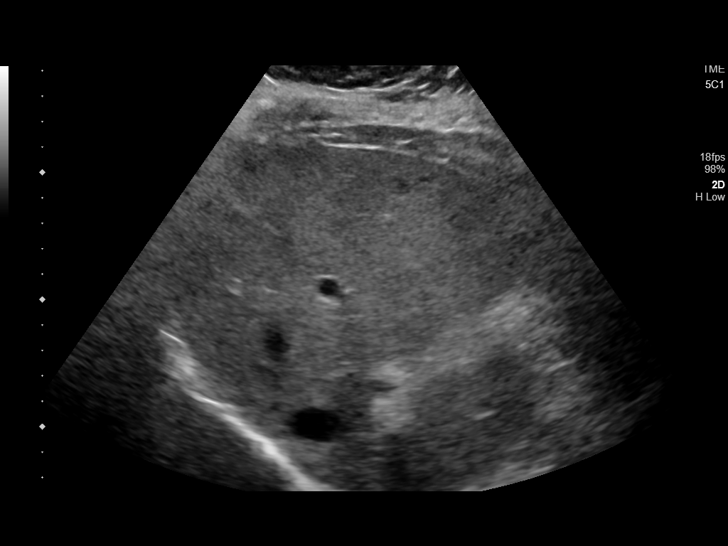
[im 59/65]
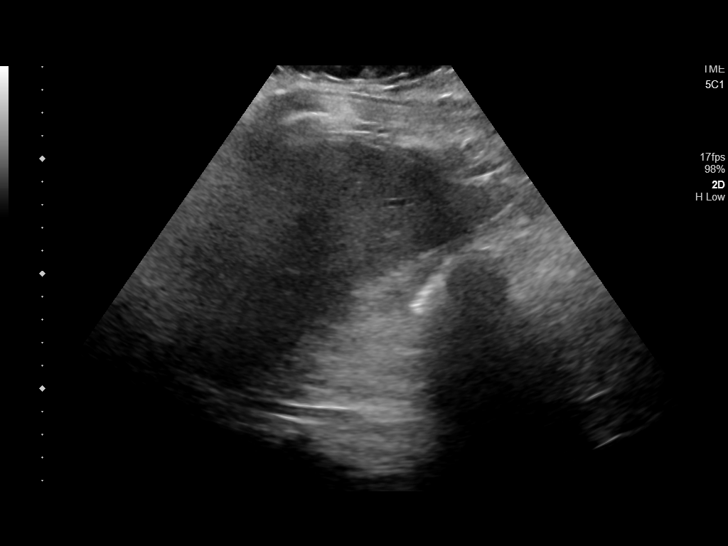
[im 65/65]
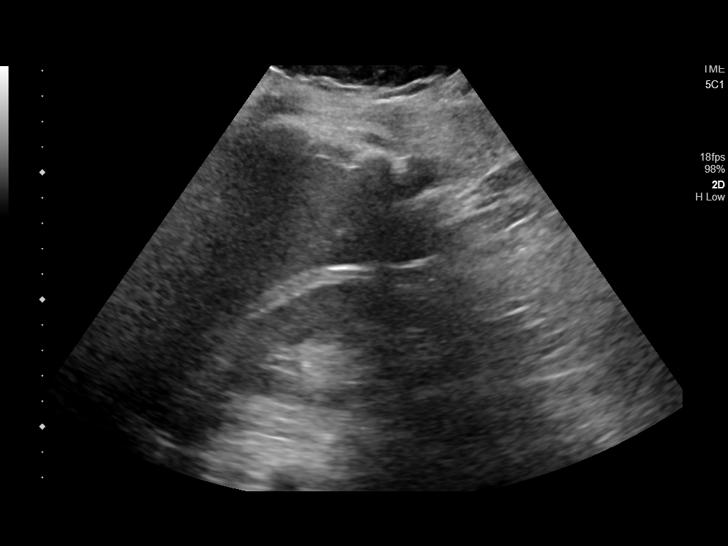

[14 of 25 positions shown; findings below may reference images not displayed]

FINDINGS: Gallbladder:

Gallbladder is decompressed with multiple gallstones within.

Common bile duct:

Diameter: 6.5 mm

Liver:

No focal lesion identified. Within normal limits in parenchymal
echogenicity. Portal vein is patent on color Doppler imaging with
normal direction of blood flow towards the liver.
IMPRESSION: Multiple gallstones without complicating factors.

## 2019-05-06 ENCOUNTER — Ambulatory Visit: Payer: BC Managed Care – PPO | Attending: Internal Medicine

## 2019-05-06 DIAGNOSIS — Z23 Encounter for immunization: Secondary | ICD-10-CM

## 2019-05-06 NOTE — Progress Notes (Signed)
   Covid-19 Vaccination Clinic  Name:  Charles Bell    MRN: [id number] DOB: 1970-07-13  05/06/2019  Charles Bell was observed post Covid-19 immunization for 15 minutes without incident. He was provided with Vaccine Information Sheet and instruction to access the V-Safe system.   Charles Bell was instructed to call 911 with any severe reactions post vaccine: Marland Kitchen Difficulty breathing  . Swelling of face and throat  . A fast heartbeat  . A bad rash all over body  . Dizziness and weakness   Immunizations Administered    Name Date Dose VIS Date Route   Pfizer COVID-19 Vaccine 05/06/2019 10:21 AM 0.3 mL 01/06/2019 Intramuscular   Manufacturer: [employer], [employer]   Lot: [id number]   NDC: [address]-1

## 2019-05-30 ENCOUNTER — Ambulatory Visit: Payer: BC Managed Care – PPO | Attending: Internal Medicine

## 2019-05-30 DIAGNOSIS — Z23 Encounter for immunization: Secondary | ICD-10-CM

## 2019-05-30 NOTE — Progress Notes (Signed)
   Covid-19 Vaccination Clinic  Name:  Charles Bell    MRN: [id number] DOB: 20-Nov-1970  05/30/2019  [name] was observed post Covid-19 immunization for 15 minutes without incident. He was provided with Vaccine Information Sheet and instruction to access the V-Safe system.   [name] was instructed to call 911 with any severe reactions post vaccine: Marland Kitchen Difficulty breathing  . Swelling of face and throat  . A fast heartbeat  . A bad rash all over body  . Dizziness and weakness   Immunizations Administered    Name Date Dose VIS Date Route   Pfizer COVID-19 Vaccine 05/30/2019 12:32 PM 0.3 mL 03/22/2018 Intramuscular   Manufacturer: [employer], [employer]   Lot: [id number]   NDC: [address]-4

## 2020-06-02 ENCOUNTER — Emergency Department
Admission: EM | Admit: 2020-06-02 | Discharge: 2020-06-02 | Disposition: A | Discharge status: Home / Self Care | Payer: BC Managed Care – PPO | Attending: Emergency Medicine | Admitting: Emergency Medicine

## 2020-06-02 ENCOUNTER — Emergency Department: Payer: BC Managed Care – PPO

## 2020-06-02 ENCOUNTER — Other Ambulatory Visit: Payer: Self-pay

## 2020-06-02 DIAGNOSIS — R109 Unspecified abdominal pain: Secondary | ICD-10-CM | POA: Diagnosis present

## 2020-06-02 DIAGNOSIS — R319 Hematuria, unspecified: Secondary | ICD-10-CM | POA: Diagnosis not present

## 2020-06-02 LAB — BASIC METABOLIC PANEL
Anion gap: 15 (ref 5–15)
BUN: 13 mg/dL (ref 6–20)
CO2: 20 mmol/L — ABNORMAL LOW (ref 22–32)
Calcium: 9.1 mg/dL (ref 8.9–10.3)
Chloride: 105 mmol/L (ref 98–111)
Creatinine, Ser: 1.14 mg/dL (ref 0.61–1.24)
GFR, Estimated: >60 mL/min (ref >60)
Glucose, Bld: 121 mg/dL — ABNORMAL HIGH (ref 70–99)
Potassium: 3.8 mmol/L (ref 3.5–5.1)
Sodium: 140 mmol/L (ref 135–145)

## 2020-06-02 LAB — URINALYSIS, COMPLETE (UACMP) WITH MICROSCOPIC
Bacteria, UA: NONE SEEN
Bilirubin Urine: NEGATIVE
Glucose, UA: NEGATIVE mg/dL
Ketones, ur: NEGATIVE mg/dL
Leukocytes,Ua: NEGATIVE
Nitrite: NEGATIVE
Protein, ur: NEGATIVE mg/dL
Specific Gravity, Urine: 1.025 (ref 1.005–1.030)
pH: 5 (ref 5.0–8.0)

## 2020-06-02 LAB — CBC
HCT: 46.9 % (ref 39.0–52.0)
Hemoglobin: 15.1 g/dL (ref 13.0–17.0)
MCH: 28.7 pg (ref 26.0–34.0)
MCHC: 32.2 g/dL (ref 30.0–36.0)
MCV: 89 fL (ref 80.0–100.0)
Platelets: 241 10*3/uL (ref 150–400)
RBC: 5.27 MIL/uL (ref 4.22–5.81)
RDW: 13.4 % (ref 11.5–15.5)
WBC: 7.1 10*3/uL (ref 4.0–10.5)
nRBC: 0 % (ref 0.0–0.2)

## 2020-06-02 MED ORDER — KETOROLAC TROMETHAMINE 60 MG/2ML IM SOLN
30.0000 mg | Freq: Once | INTRAMUSCULAR | Status: AC
  Administered 2020-06-02: 30 mg via INTRAMUSCULAR
  Filled 2020-06-02: qty 2

## 2020-06-02 MED ORDER — METHOCARBAMOL 750 MG PO TABS: 750.0000 mg | ORAL_TABLET | Freq: Four times a day (QID) | ORAL | 0 refills | Status: AC | PRN

## 2020-06-02 MED ORDER — ACETAMINOPHEN 325 MG PO TABS
650.0000 mg | ORAL_TABLET | Freq: Once | ORAL | Status: AC
  Administered 2020-06-02: 650 mg via ORAL
  Filled 2020-06-02: qty 2

## 2020-06-02 MED ORDER — KETOROLAC TROMETHAMINE 10 MG PO TABS: 10.0000 mg | ORAL_TABLET | Freq: Four times a day (QID) | ORAL | 0 refills | Status: AC | PRN

## 2020-06-02 MED ORDER — METHOCARBAMOL 500 MG PO TABS
750.0000 mg | ORAL_TABLET | Freq: Once | ORAL | Status: AC
  Administered 2020-06-02: 750 mg via ORAL
  Filled 2020-06-02: qty 2

## 2020-06-02 NOTE — ED Triage Notes (Signed)
Pt presents c/o right flank pain x1 week. Reports pain worsening with movement of pain in RLQ of abd. Reports ome nausea. Denies urinary symptoms.

## 2020-06-02 NOTE — Discharge Instructions (Addendum)
Please use the Toradol and Robaxin as prescribed.  You may also use Tylenol, up to 1000 mg 4 times daily as needed for pain.  Please follow-up with urology as listed.  Return to the emergency department if you experience any worsening of symptoms.

## 2020-06-04 NOTE — ED Provider Notes (Signed)
[facility] Emergency Department Provider Note   ____________________________________________   Event Date/Time   First MD Initiated Contact with Patient 06/02/20 1926     (approximate)  I have reviewed the triage vital signs and the nursing notes.   HISTORY  Chief Complaint Flank Pain   HPI Charles Bell is a 50 y.o. male who presents to the emergency department for evaluation of right flank pain over the last 1 week.  He reports that the pain is sometimes worse with movement, sometimes it is there all the time regardless of movement.  He does endorse some associated nausea.  He denies any urinary symptoms, states that today it began wrapping around from his flank into the right upper quadrant.  He denies any dysuria, hematuria or difficulty urinating.  Denies any fevers or other systemic symptoms.        Past Medical History:  Diagnosis Date  . Vitamin D deficiency     Patient Active Problem List   Diagnosis Date Noted  . Peroneal tendonitis 10/15/2015    Past Surgical History:  Procedure Laterality Date  . FINGER SURGERY Right    ring finger    Prior to Admission medications   Medication Sig Start Date End Date Taking? Authorizing Provider  ketorolac (TORADOL) 10 MG tablet Take 1 tablet (10 mg total) by mouth every 6 (six) hours as needed for up to 5 days. 06/02/20 06/07/20 Yes Rayfield Beem, Ruben Gottron, PA  methocarbamol (ROBAXIN-750) 750 MG tablet Take 1 tablet (750 mg total) by mouth 4 (four) times daily as needed for up to 10 days for muscle spasms. 06/02/20 06/12/20 Yes [place] Scheidegger, Ruben Gottron, PA  diphenhydrAMINE (BENADRYL) 25 MG tablet Take 100 mg by mouth daily.    [provider]  famotidine (PEPCID) 20 MG tablet Take 20 mg by mouth 2 (two) times daily.    [provider]  fexofenadine-pseudoephedrine (ALLEGRA-D ALLERGY & CONGESTION) 180-240 MG 24 hr tablet Take 1 tablet by mouth daily. 08/22/15   Hassan Rowan, MD  fluticasone  (FLONASE) 50 MCG/ACT nasal spray Place 2 sprays into both nostrils daily. 08/22/15   Hassan Rowan, MD  meloxicam (MOBIC) 15 MG tablet Take 1 tablet (15 mg total) by mouth daily. 10/15/15   Vivi Barrack, DPM  ondansetron (ZOFRAN ODT) 4 MG disintegrating tablet Take 1 tablet (4 mg total) by mouth every 8 (eight) hours as needed for nausea or vomiting. 06/26/17   Minna Antis, MD  valACYclovir (VALTREX) 1000 MG tablet Take 1,000 mg by mouth daily as needed. 06/28/17   [provider]    Allergies Patient has no known allergies.  Family History  Problem Relation Age of Onset  . Cancer Maternal Aunt   . Prostate cancer Paternal Uncle   . Lung cancer Paternal Uncle   . Diabetes Paternal Uncle     Social History Social History   Tobacco Use  . Smoking status: Never Smoker  . Smokeless tobacco: Never Used  Substance Use Topics  . Alcohol use: No  . Drug use: No    Review of Systems Constitutional: No fever/chills Eyes: No visual changes. ENT: No sore throat. Cardiovascular: Denies chest pain. Respiratory: Denies shortness of breath. Gastrointestinal: + Right upper quadrant abdominal pain.  + nausea, no vomiting.  No diarrhea.  No constipation. + Right flank pain Genitourinary: Negative for dysuria. Musculoskeletal: Negative for back pain. Skin: Negative for rash. Neurological: Negative for headaches, focal weakness or numbness.   ____________________________________________   PHYSICAL EXAM:  VITAL SIGNS: ED Triage Vitals  Enc Vitals Group     BP 06/02/20 1805 (!) 162/86     Pulse Rate 06/02/20 1805 83     Resp 06/02/20 1805 16     Temp 06/02/20 1805 98.1 F (36.7 C)     Temp Source 06/02/20 1805 Oral     SpO2 06/02/20 1805 99 %     Weight 06/02/20 1806 240 lb (108.9 kg)     Height --      Head Circumference --      Peak Flow --      Pain Score 06/02/20 1805 9     Pain Loc --      Pain Edu? --      Excl. in GC? --    Constitutional: Alert and  oriented. Well appearing and in no acute distress. Eyes: Conjunctivae are normal. PERRL. EOMI. Head: Atraumatic. Nose: No congestion/rhinnorhea. Mouth/Throat: Mucous membranes are moist.  Oropharynx non-erythematous. Neck: No stridor.   Cardiovascular: Normal rate, regular rhythm. Grossly normal heart sounds.  Good peripheral circulation. Respiratory: Normal respiratory effort.  No retractions. Lungs CTAB. Gastrointestinal: Soft and nontender.  No Murphy's sign.  No distention. No abdominal bruits. + Right CVA tenderness, negative for left CVA tenderness. Musculoskeletal: No midline tenderness of the lumbar spine, 5/5 strength in bilateral lower extremities in ankle plantarflexion, dorsiflexion, knee flexion and extension.  No lower extremity tenderness nor edema.  No joint effusions. Neurologic:  Normal speech and language. No gross focal neurologic deficits are appreciated. No gait instability. Skin:  Skin is warm, dry and intact. No rash noted. Psychiatric: Mood and affect are normal. Speech and behavior are normal.  ____________________________________________   LABS (all labs ordered are listed, but only abnormal results are displayed)  Labs Reviewed  URINALYSIS, COMPLETE (UACMP) WITH MICROSCOPIC - Abnormal; Notable for the following components:      Result Value   Color, Urine YELLOW (*)    APPearance CLEAR (*)    Hgb urine dipstick MODERATE (*)    All other components within normal limits  BASIC METABOLIC PANEL - Abnormal; Notable for the following components:   CO2 20 (*)    Glucose, Bld 121 (*)    All other components within normal limits  CBC    ____________________________________________  RADIOLOGY  CT renal stone study was obtained and is reported by radiology as negative for any acute renal stone.  There is evidence of cholelithiasis as well as a left renal cyst recommending outpatient management.  ____________________________________________   INITIAL  IMPRESSION / ASSESSMENT AND PLAN / ED COURSE  As part of my medical decision making, I reviewed the following data within the electronic MEDICAL RECORD NUMBER Nursing notes reviewed and incorporated, Labs reviewed and Notes from prior ED visits        Patient is a 50 year old male who presents to the emergency department for evaluation of right flank pain.  See HPI for further details.  In triage, patient is mildly hypertensive otherwise normal vital signs.  On physical exam, he has positive CVA tenderness on the right, no pain is reproduced with palpation of the abdomen.  BMP and CBC are grossly unremarkable.  Urinalysis demonstrates moderate amount of hemoglobin without any evidence of infection.  CT without any evidence of obstructing stone, though there is evidence of cholelithiasis.  Differentials considered include renal stone, biliary colic, musculoskeletal back pain.  Given reassuring CT and labs, will initiate Toradol and muscle relaxant.  Recommended close urology follow-up given the  presence of hematuria with no clear cause and to follow-up on his left renal imaging.  Patient is amenable with this plan, return precautions were discussed and he stable this time for outpatient management.      ____________________________________________   FINAL CLINICAL IMPRESSION(S) / ED DIAGNOSES  Final diagnoses:  Right flank pain  Hematuria, unspecified type     ED Discharge Orders         Ordered    ketorolac (TORADOL) 10 MG tablet  Every 6 hours PRN        06/02/20 2032    methocarbamol (ROBAXIN-750) 750 MG tablet  4 times daily PRN        06/02/20 2032          *Please note:  DERWARD MARPLE was evaluated in Emergency Department on 06/04/2020 for the symptoms described in the history of present illness. He was evaluated in the context of the global COVID-19 pandemic, which necessitated consideration that the patient might be at risk for infection with the SARS-CoV-2 virus that causes  COVID-19. Institutional protocols and algorithms that pertain to the evaluation of patients at risk for COVID-19 are in a state of rapid change based on information released by regulatory bodies including the CDC and federal and state organizations. These policies and algorithms were followed during the patient's care in the ED.  Some ED evaluations and interventions may be delayed as a result of limited staffing during and the pandemic.*   Note:  This document was prepared using Dragon voice recognition software and may include unintentional dictation errors.   Lucy Chris, PA 06/04/20 1910    Sharman Cheek, MD 06/06/20 [phone]

## 2022-01-01 IMAGING — CT CT RENAL STONE PROTOCOL
2 of 4 series · 16 of 46 positions shown, 18 images · non-contrast
Comparison: CT abdomen pelvis dated 06/26/2017.

CLINICAL DATA: 49-year-old male with right flank pain. Concern for
kidney stone.

EXAM:
CT ABDOMEN AND PELVIS WITHOUT CONTRAST
TECHNIQUE: Multidetector CT imaging of the abdomen and pelvis was performed
following the standard protocol without IV contrast.

[Series 2: stone full standard · axial · 0.98mm/px · z∈[-226,+250]mm · 13 of 105 slices shown, 15 images]
[im 5/105  soft-tissue]
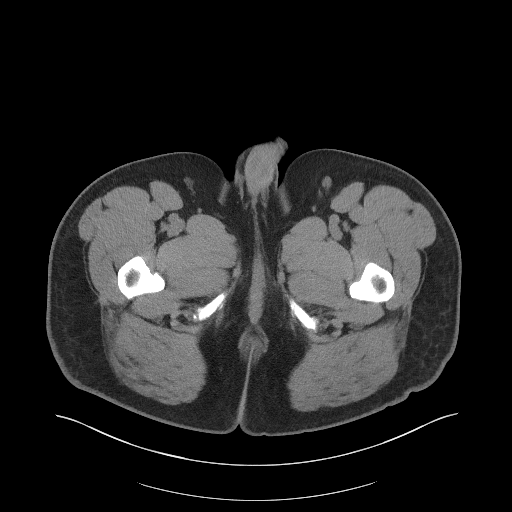
[im 5/105  bone]
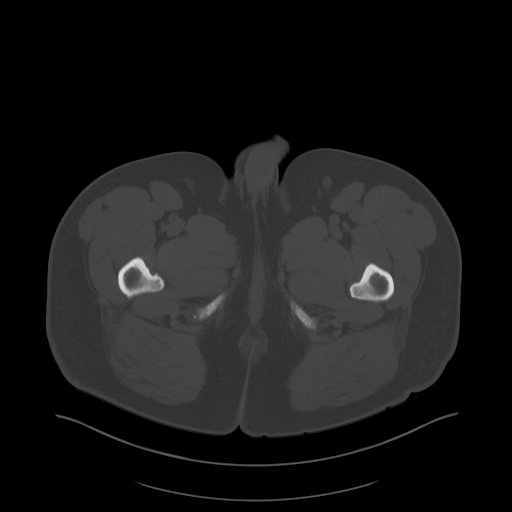
[im 13/105  soft-tissue]
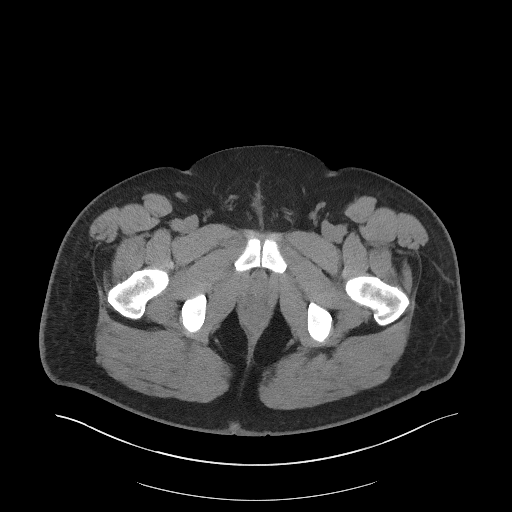
[im 21/105  soft-tissue]
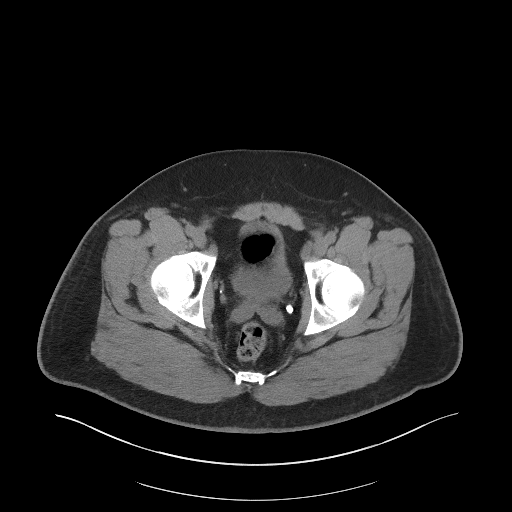
[im 30/105  soft-tissue]
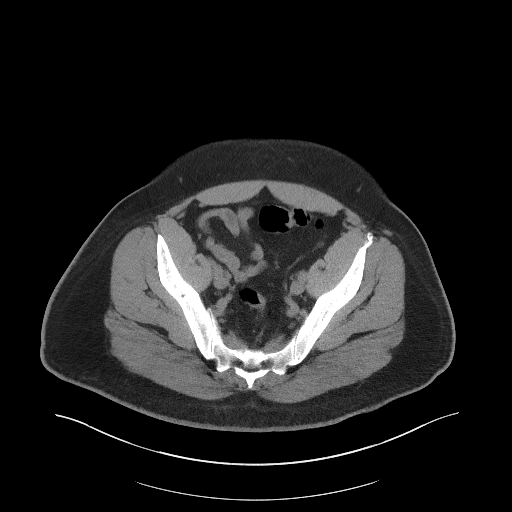
[im 38/105  soft-tissue]
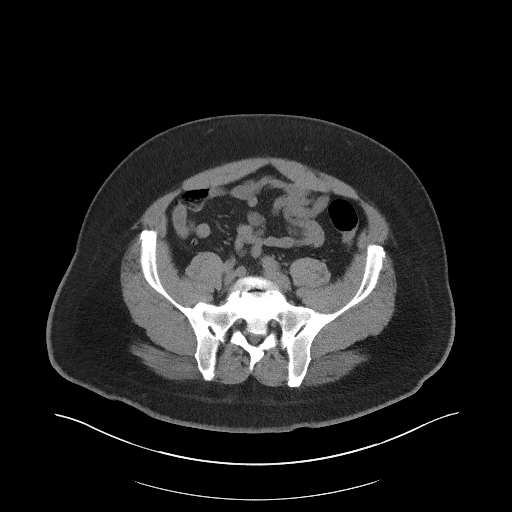
[im 46/105  soft-tissue]
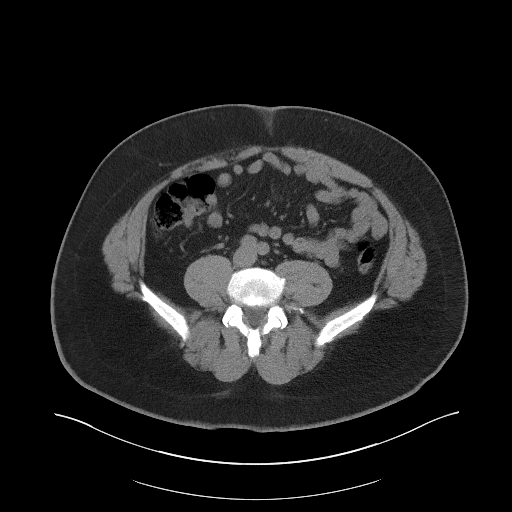
[im 55/105  soft-tissue]
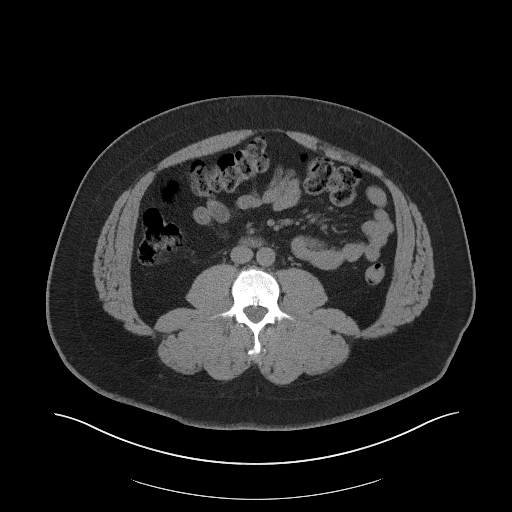
[im 59/105  soft-tissue]
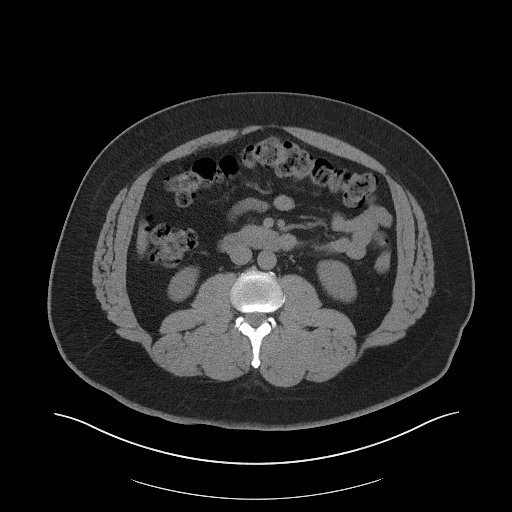
[im 67/105  soft-tissue]
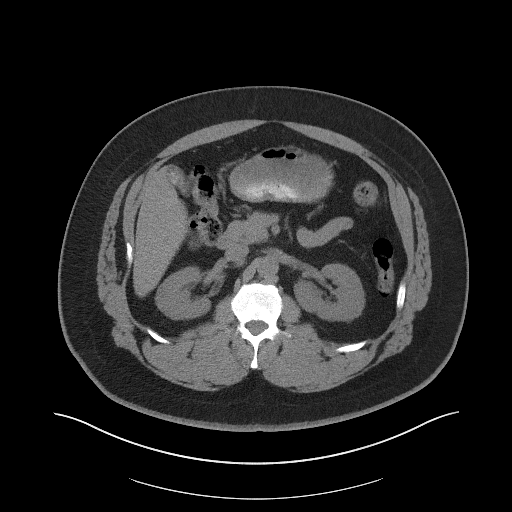
[im 67/105  bone]
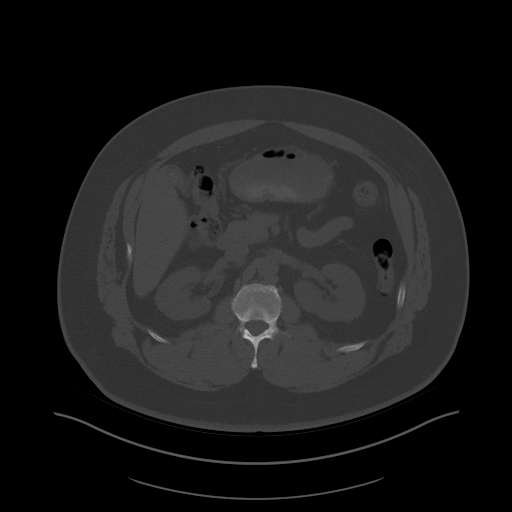
[im 75/105  soft-tissue]
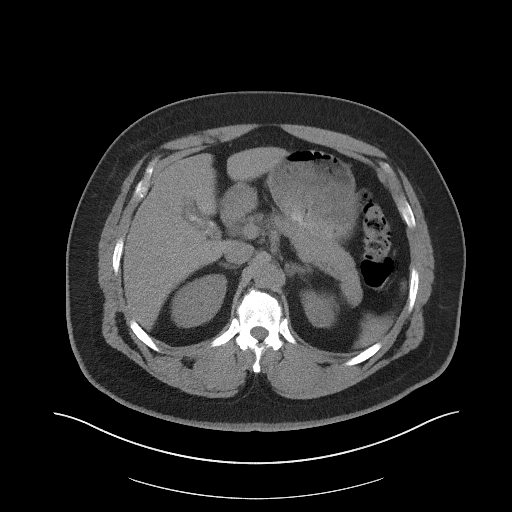
[im 84/105  soft-tissue]
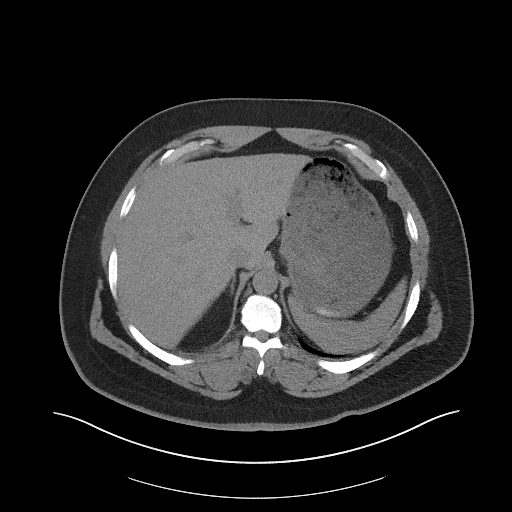
[im 92/105  soft-tissue]
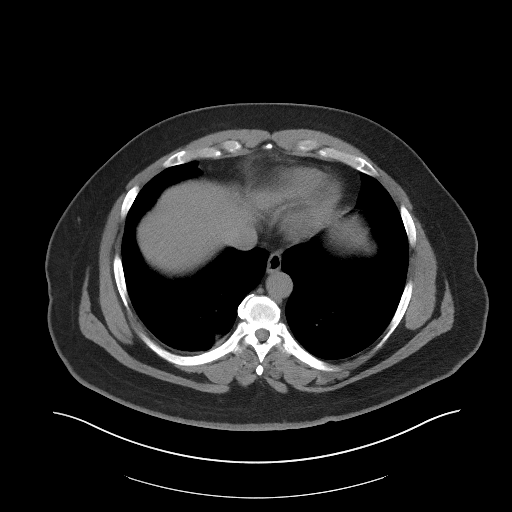
[im 100/105  soft-tissue]
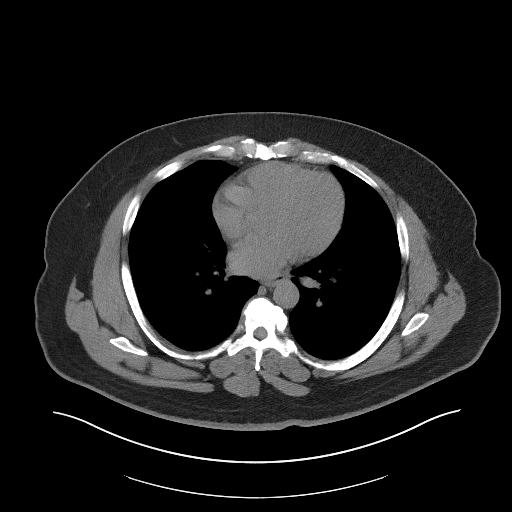

[Series 5: coronal · coronal · 0.91mm/px · 3 of 172 slices shown]
[im 58/172  soft-tissue]
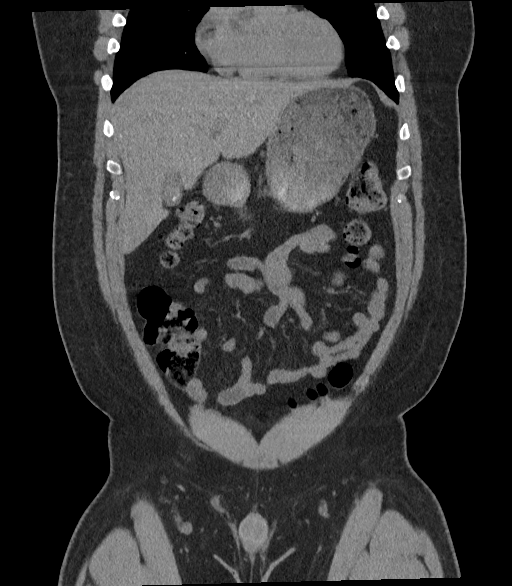
[im 77/172  soft-tissue]
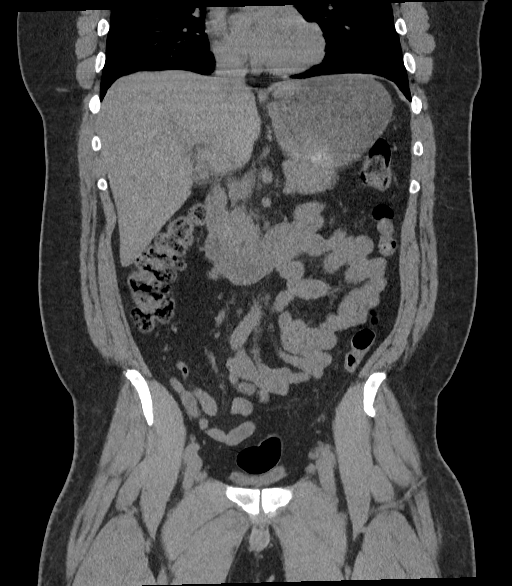
[im 96/172  soft-tissue]
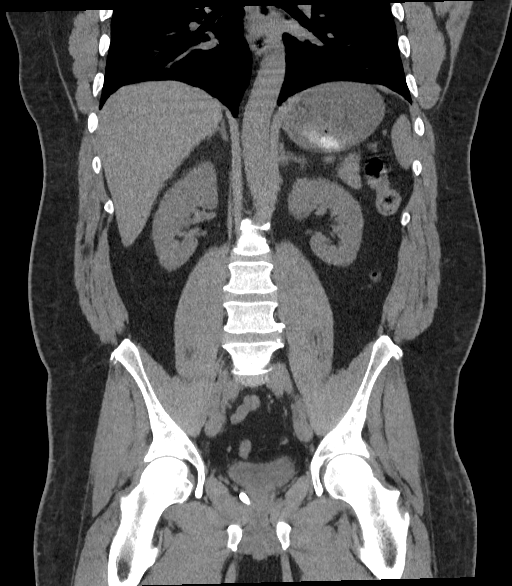

[16 of 46 positions shown; findings below may reference images not displayed]

FINDINGS: Evaluation of this exam is limited in the absence of intravenous
contrast.

Lower chest: Right lung base atelectasis/scarring. Several small
bibasilar granuloma. The visualized lungs are otherwise clear.

No intra-abdominal free air or free fluid.

Hepatobiliary: The liver is unremarkable. No intrahepatic biliary
ductal dilatation. The gallbladder is filled with stones. No
pericholecystic fluid or evidence of acute cholecystitis by CT.

Pancreas: Unremarkable. No pancreatic ductal dilatation or
surrounding inflammatory changes.

Spleen: Normal in size without focal abnormality.

Adrenals/Urinary Tract: The adrenal glands unremarkable. There is no
hydronephrosis or nephrolithiasis on either side. There is a 15 mm
exophytic hypodense lesion from the medial superior pole of the left
kidney, not characterized on this CT. Further evaluation with
ultrasound on a nonemergent/outpatient basis recommended. The
visualized ureters and urinary bladder appear unremarkable.

Stomach/Bowel: There is no bowel obstruction or active inflammation.
The appendix is normal.

Vascular/Lymphatic: Mild aortoiliac atherosclerotic disease. The IVC
is unremarkable. No portal venous gas. There is no adenopathy.

Reproductive: The prostate and seminal vesicles are grossly
unremarkable. No pelvic mass.

Other: None

Musculoskeletal: Mild degenerative changes of the spine. No acute
osseous pathology.
IMPRESSION: 1. No acute intra-abdominal or pelvic pathology. No hydronephrosis
or nephrolithiasis.
2. Cholelithiasis.
3. A 15 mm exophytic hypodense lesion from the medial superior pole
of the left kidney. Further evaluation with ultrasound on a
nonemergent/outpatient basis recommended.
4. Aortic Atherosclerosis ([id number]-OP0.0).

## 2022-03-16 ENCOUNTER — Other Ambulatory Visit: Payer: Self-pay

## 2022-05-14 ENCOUNTER — Emergency Department: Payer: BC Managed Care – PPO

## 2022-05-14 ENCOUNTER — Emergency Department
Admission: EM | Admit: 2022-05-14 | Discharge: 2022-05-14 | Disposition: A | Discharge status: Home / Self Care | Payer: BC Managed Care – PPO | Attending: Emergency Medicine | Admitting: Emergency Medicine

## 2022-05-14 ENCOUNTER — Other Ambulatory Visit: Payer: Self-pay

## 2022-05-14 ENCOUNTER — Encounter: Payer: Self-pay | Admitting: Emergency Medicine

## 2022-05-14 DIAGNOSIS — M25511 Pain in right shoulder: Secondary | ICD-10-CM | POA: Diagnosis not present

## 2022-05-14 DIAGNOSIS — K8070 Calculus of gallbladder and bile duct without cholecystitis without obstruction: Secondary | ICD-10-CM | POA: Insufficient documentation

## 2022-05-14 DIAGNOSIS — K8020 Calculus of gallbladder without cholecystitis without obstruction: Secondary | ICD-10-CM

## 2022-05-14 DIAGNOSIS — K8050 Calculus of bile duct without cholangitis or cholecystitis without obstruction: Secondary | ICD-10-CM

## 2022-05-14 DIAGNOSIS — R1011 Right upper quadrant pain: Secondary | ICD-10-CM

## 2022-05-14 DIAGNOSIS — R109 Unspecified abdominal pain: Secondary | ICD-10-CM

## 2022-05-14 LAB — COMPREHENSIVE METABOLIC PANEL
ALT: 25 U/L (ref 0–44)
AST: 25 U/L (ref 15–41)
Albumin: 3.6 g/dL (ref 3.5–5.0)
Alkaline Phosphatase: 63 U/L (ref 38–126)
Anion gap: 8 (ref 5–15)
BUN: 19 mg/dL (ref 6–20)
CO2: 24 mmol/L (ref 22–32)
Calcium: 8.7 mg/dL — ABNORMAL LOW (ref 8.9–10.3)
Chloride: 107 mmol/L (ref 98–111)
Creatinine, Ser: 1.06 mg/dL (ref 0.61–1.24)
GFR, Estimated: >60 mL/min (ref >60)
Glucose, Bld: 122 mg/dL — ABNORMAL HIGH (ref 70–99)
Potassium: 4 mmol/L (ref 3.5–5.1)
Sodium: 139 mmol/L (ref 135–145)
Total Bilirubin: 0.3 mg/dL (ref 0.3–1.2)
Total Protein: 7 g/dL (ref 6.5–8.1)

## 2022-05-14 LAB — CBC WITH DIFFERENTIAL/PLATELET
Abs Immature Granulocytes: 0.01 10*3/uL (ref 0.00–0.07)
Basophils Absolute: 0 10*3/uL (ref 0.0–0.1)
Basophils Relative: 0 %
Eosinophils Absolute: 0.1 10*3/uL (ref 0.0–0.5)
Eosinophils Relative: 3 %
HCT: 43.5 % (ref 39.0–52.0)
Hemoglobin: 14.2 g/dL (ref 13.0–17.0)
Immature Granulocytes: 0 %
Lymphocytes Relative: 43 %
Lymphs Abs: 2.3 10*3/uL (ref 0.7–4.0)
MCH: 28.1 pg (ref 26.0–34.0)
MCHC: 32.6 g/dL (ref 30.0–36.0)
MCV: 86.1 fL (ref 80.0–100.0)
Monocytes Absolute: 0.7 10*3/uL (ref 0.1–1.0)
Monocytes Relative: 13 %
Neutro Abs: 2.2 10*3/uL (ref 1.7–7.7)
Neutrophils Relative %: 41 %
Platelets: 244 10*3/uL (ref 150–400)
RBC: 5.05 MIL/uL (ref 4.22–5.81)
RDW: 13.8 % (ref 11.5–15.5)
WBC: 5.3 10*3/uL (ref 4.0–10.5)
nRBC: 0 % (ref 0.0–0.2)

## 2022-05-14 LAB — LIPASE, BLOOD: Lipase: 35 U/L (ref 11–51)

## 2022-05-14 LAB — TROPONIN I (HIGH SENSITIVITY): Troponin I (High Sensitivity): 4 ng/L (ref <18)

## 2022-05-14 MED ORDER — KETOROLAC TROMETHAMINE 30 MG/ML IJ SOLN
15.0000 mg | Freq: Once | INTRAMUSCULAR | Status: AC
  Administered 2022-05-14: 15 mg via INTRAVENOUS
  Filled 2022-05-14: qty 1

## 2022-05-14 MED ORDER — FAMOTIDINE IN NACL 20-0.9 MG/50ML-% IV SOLN
20.0000 mg | Freq: Once | INTRAVENOUS | Status: AC
  Administered 2022-05-14: 20 mg via INTRAVENOUS
  Filled 2022-05-14: qty 50

## 2022-05-14 MED ORDER — HYDROMORPHONE HCL 1 MG/ML IJ SOLN
0.5000 mg | Freq: Once | INTRAMUSCULAR | Status: AC
  Administered 2022-05-14: 0.5 mg via INTRAVENOUS
  Filled 2022-05-14: qty 0.5

## 2022-05-14 MED ORDER — ONDANSETRON HCL 4 MG/2ML IJ SOLN
4.0000 mg | Freq: Once | INTRAMUSCULAR | Status: AC
  Administered 2022-05-14: 4 mg via INTRAVENOUS
  Filled 2022-05-14: qty 2

## 2022-05-14 MED ORDER — ONDANSETRON 4 MG PO TBDP: 4.0000 mg | ORAL_TABLET | Freq: Three times a day (TID) | ORAL | 0 refills | Status: AC | PRN

## 2022-05-14 MED ORDER — OXYCODONE-ACETAMINOPHEN 5-325 MG PO TABS: 1.0000 | ORAL_TABLET | ORAL | 0 refills | Status: DC | PRN

## 2022-05-14 MED ORDER — OXYCODONE-ACETAMINOPHEN 5-325 MG PO TABS
1.0000 | ORAL_TABLET | Freq: Once | ORAL | Status: AC
  Administered 2022-05-14: 1 via ORAL
  Filled 2022-05-14: qty 1

## 2022-05-14 MED ORDER — SODIUM CHLORIDE 0.9 % IV BOLUS
1000.0000 mL | Freq: Once | INTRAVENOUS | Status: AC
  Administered 2022-05-14: 1000 mL via INTRAVENOUS

## 2022-05-14 MED ORDER — HYDROMORPHONE HCL 1 MG/ML IJ SOLN
0.5000 mg | Freq: Once | INTRAMUSCULAR | Status: DC
  Filled 2022-05-14: qty 0.5

## 2022-05-14 MED ORDER — ONDANSETRON HCL 4 MG/2ML IJ SOLN
4.0000 mg | Freq: Once | INTRAMUSCULAR | Status: DC
  Filled 2022-05-14: qty 2

## 2022-05-14 NOTE — ED Provider Notes (Signed)
[facility] Provider Note    Event Date/Time   First MD Initiated Contact with Patient 05/14/22 0254     (approximate)   History   Flank Pain and Shoulder Pain   HPI  Charles Bell is a 52 y.o. male  who presents to the ED from home with a chief complaint of right flank pain radiating to RUQ and right shoulder.  No trauma or injury to right shoulder. Similar symptoms previously 5 years ago when he had a gallbladder attack. Pain waxing/waning x 3 days, worsened tonight after eating [place] food. Denies associated fever/chills, chest pain, sob, nausea, vomiting or diarrhea. Denies recent travel, trauma or hormone use.       Past Medical History   Past Medical History:  Diagnosis Date   Vitamin D deficiency      Active Problem List   Patient Active Problem List   Diagnosis Date Noted   Peroneal tendonitis 10/15/2015     Past Surgical History   Past Surgical History:  Procedure Laterality Date   FINGER SURGERY Right    ring finger     Home Medications   Prior to Admission medications   Medication Sig Start Date End Date Taking? Authorizing Provider  ondansetron (ZOFRAN-ODT) 4 MG disintegrating tablet Take 1 tablet (4 mg total) by mouth every 8 (eight) hours as needed for nausea or vomiting. 05/14/22  Yes Irean Hong, MD  oxyCODONE-acetaminophen (PERCOCET/ROXICET) 5-325 MG tablet Take 1 tablet by mouth every 4 (four) hours as needed for severe pain. 05/14/22  Yes Irean Hong, MD  diphenhydrAMINE (BENADRYL) 25 MG tablet Take 100 mg by mouth daily.    [provider]  famotidine (PEPCID) 20 MG tablet Take 20 mg by mouth 2 (two) times daily.    [provider]  fexofenadine-pseudoephedrine (ALLEGRA-D ALLERGY & CONGESTION) 180-240 MG 24 hr tablet Take 1 tablet by mouth daily. 08/22/15   Hassan Rowan, MD  fluticasone (FLONASE) 50 MCG/ACT nasal spray Place 2 sprays into both nostrils daily. 08/22/15   Hassan Rowan, MD   meloxicam (MOBIC) 15 MG tablet Take 1 tablet (15 mg total) by mouth daily. 10/15/15   Vivi Barrack, DPM  valACYclovir (VALTREX) 1000 MG tablet Take 1,000 mg by mouth daily as needed. 06/28/17   [provider]     Allergies  Patient has no known allergies.   Family History   Family History  Problem Relation Age of Onset   Cancer Maternal Aunt    Prostate cancer Paternal Uncle    Lung cancer Paternal Uncle    Diabetes Paternal Uncle      Physical Exam  Triage Vital Signs: ED Triage Vitals  Enc Vitals Group     BP 05/14/22 0238 (!) 160/69     Pulse Rate 05/14/22 0238 (!) 58     Resp 05/14/22 0238 18     Temp 05/14/22 0238 98.2 F (36.8 C)     Temp Source 05/14/22 0238 Oral     SpO2 05/14/22 0238 95 %     Weight 05/14/22 0240 239 lb (108.4 kg)     Height 05/14/22 0240  (1.803 m)     Head Circumference --      Peak Flow --      Pain Score --      Pain Loc --      Pain Edu? --      Excl. in GC? --     Updated Vital Signs: BP Marland Kitchen)  140/84   Pulse 60   Temp 98.2 F (36.8 C) (Oral)   Resp 14   Ht 5\' 11"  (1.803 m)   Wt 108.4 kg   SpO2 100%   BMI 33.33 kg/m    General: Awake, mild distress.  CV:  RRR. Good peripheral perfusion.  Resp:  Normal effort. CTAB. Abd:  Mild tenderness to palpation RUQ and right CVAT without rebound or guarding. No distention.  Other:  No truncal vesicles.  Right shoulder not tender to palpation with full range of motion without pain.  2+ radial pulses.  Brisk, less than 5-second cap refill.   ED Results / Procedures / Treatments  Labs (all labs ordered are listed, but only abnormal results are displayed) Labs Reviewed  COMPREHENSIVE METABOLIC PANEL - Abnormal; Notable for the following components:      Result Value   Glucose, Bld 122 (*)    Calcium 8.7 (*)    All other components within normal limits  CBC WITH DIFFERENTIAL/PLATELET  LIPASE, BLOOD  URINALYSIS, ROUTINE W REFLEX MICROSCOPIC  TROPONIN I (HIGH  SENSITIVITY)     EKG  ED ECG REPORT I, Kejon Feild J, the attending physician, personally viewed and interpreted this ECG.   Date: 05/14/2022  EKG Time: 0322  Rate: 63  Rhythm: normal sinus rhythm  Axis: Normal  Intervals:none  ST&T Change: Nonspecific    RADIOLOGY I have independently visualized and interpreted patient's ultrasound as well as noted the radiology interpretation:  Ultrasound: Chronic cholelithiasis, no cholecystitis but increased CBD diameter from prior studies.  Difficult to exclude choledocholithiasis.  MRCP: Cholelithiasis without choledocholithiasis  Official radiology report(s): MR ABDOMEN MRCP WO CONTRAST  Result Date: 05/14/2022 CLINICAL DATA:  52 year old male with history of cholelithiasis and abdominal pain. EXAM: MRI ABDOMEN WITHOUT CONTRAST  (INCLUDING MRCP) TECHNIQUE: Multiplanar multisequence MR imaging of the abdomen was performed. Heavily T2-weighted images of the biliary and pancreatic ducts were obtained, and three-dimensional MRCP images were rendered by post processing. COMPARISON:  No prior abdominal MRI. Abdominal ultrasound 05/14/2022. CT of the abdomen and pelvis without contrast 06/02/2020. FINDINGS: Comment: Today's study is limited for detection and characterization of visceral and/or vascular lesions by lack of IV gadolinium. Lower chest: Unremarkable. Hepatobiliary: No discrete cystic or solid hepatic lesions are confidently identified on today's noncontrast study. There are multiple filling defects within the gallbladder which appears contracted around these filling defects, compatible with gallstones. Gallbladder wall does not appear thickened. No pericholecystic fluid or surrounding inflammatory changes. No intra or extrahepatic biliary ductal dilatation noted on MRCP images. Common bile duct is upper limits of normal in size measuring 6 mm in the porta hepatis. No filling defect in the common bile duct to suggest choledocholithiasis.  Pancreas: No definite pancreatic mass or peripancreatic fluid collections or inflammatory changes noted on today's noncontrast examination. No pancreatic ductal dilatation noted on MRCP images. Spleen:  Unremarkable. Adrenals/Urinary Tract: Several T1 hypointense, T2 hyperintense lesions in the left kidney, incompletely characterize without IV gadolinium, but likely to represent cysts, largest of which extends medially off the upper pole of the left kidney measuring up to 2.1 x 1.4 cm (axial image 20 of series 4). One of these lesions in the upper pole of the left kidney (axial image 15 of series 4) has a single thin internal septation, indicating a minimally complex cystic lesion. These lesions are minimally increased in size compared to prior CT of the abdomen and pelvis 2019, suggesting benign lesions (no imaging follow-up recommended). Right kidney and bilateral adrenal glands  are otherwise normal in appearance. No hydroureteronephrosis in the visualized portions of the abdomen. Stomach/Bowel: Visualized portions are unremarkable. Vascular/Lymphatic: No aneurysm identified in the visualized abdominal vasculature. No lymphadenopathy noted in the abdomen. Other: No significant volume of ascites noted in the visualized portions of the peritoneal cavity. Musculoskeletal: No aggressive appearing osseous lesions are noted in the visualized portions of the skeleton. IMPRESSION: 1. Study is positive for cholelithiasis, but there are no imaging findings to suggest an acute cholecystitis. 2. No choledocholithiasis or findings to suggest biliary tract obstruction. 3. Additional incidental findings, as above. Electronically Signed   By: Trudie Reed M.D.   On: 05/14/2022 05:42   MR 3D Recon At Scanner  Result Date: 05/14/2022 CLINICAL DATA:  52 year old male with history of cholelithiasis and abdominal pain. EXAM: MRI ABDOMEN WITHOUT CONTRAST  (INCLUDING MRCP) TECHNIQUE: Multiplanar multisequence MR imaging of the  abdomen was performed. Heavily T2-weighted images of the biliary and pancreatic ducts were obtained, and three-dimensional MRCP images were rendered by post processing. COMPARISON:  No prior abdominal MRI. Abdominal ultrasound 05/14/2022. CT of the abdomen and pelvis without contrast 06/02/2020. FINDINGS: Comment: Today's study is limited for detection and characterization of visceral and/or vascular lesions by lack of IV gadolinium. Lower chest: Unremarkable. Hepatobiliary: No discrete cystic or solid hepatic lesions are confidently identified on today's noncontrast study. There are multiple filling defects within the gallbladder which appears contracted around these filling defects, compatible with gallstones. Gallbladder wall does not appear thickened. No pericholecystic fluid or surrounding inflammatory changes. No intra or extrahepatic biliary ductal dilatation noted on MRCP images. Common bile duct is upper limits of normal in size measuring 6 mm in the porta hepatis. No filling defect in the common bile duct to suggest choledocholithiasis. Pancreas: No definite pancreatic mass or peripancreatic fluid collections or inflammatory changes noted on today's noncontrast examination. No pancreatic ductal dilatation noted on MRCP images. Spleen:  Unremarkable. Adrenals/Urinary Tract: Several T1 hypointense, T2 hyperintense lesions in the left kidney, incompletely characterize without IV gadolinium, but likely to represent cysts, largest of which extends medially off the upper pole of the left kidney measuring up to 2.1 x 1.4 cm (axial image 20 of series 4). One of these lesions in the upper pole of the left kidney (axial image 15 of series 4) has a single thin internal septation, indicating a minimally complex cystic lesion. These lesions are minimally increased in size compared to prior CT of the abdomen and pelvis 2019, suggesting benign lesions (no imaging follow-up recommended). Right kidney and bilateral adrenal  glands are otherwise normal in appearance. No hydroureteronephrosis in the visualized portions of the abdomen. Stomach/Bowel: Visualized portions are unremarkable. Vascular/Lymphatic: No aneurysm identified in the visualized abdominal vasculature. No lymphadenopathy noted in the abdomen. Other: No significant volume of ascites noted in the visualized portions of the peritoneal cavity. Musculoskeletal: No aggressive appearing osseous lesions are noted in the visualized portions of the skeleton. IMPRESSION: 1. Study is positive for cholelithiasis, but there are no imaging findings to suggest an acute cholecystitis. 2. No choledocholithiasis or findings to suggest biliary tract obstruction. 3. Additional incidental findings, as above. Electronically Signed   By: Trudie Reed M.D.   On: 05/14/2022 05:42   US ABDOMEN LIMITED RUQ (LIVER/GB)  Result Date: 05/14/2022 CLINICAL DATA:  52 year old male with right upper quadrant pain. EXAM: ULTRASOUND ABDOMEN LIMITED RIGHT UPPER QUADRANT COMPARISON:  CT Abdomen and Pelvis 06/02/2020. Ultrasound 06/26/2017. FINDINGS: Gallbladder: Chronic cholelithiasis. Individual gallstones size estimated up to 1.2 cm. And most  ultrasound images today are a wall-echo-shadow sign (Wes sign). Wall thickness appears to remain normal to 2 mm. No sonographic Murphy sign elicited. Common bile duct: Diameter: 8-9 mm (versus 6-7 mm in 2019 and on the prior CT. No filling defect in the visible duct. Liver: Liver echogenicity at the upper limits of normal (image 62). No intrahepatic biliary ductal dilatation or liver lesion identified. Portal vein is patent on color Doppler imaging with normal direction of blood flow towards the liver. Other: Negative visible right kidney.  No free fluid. IMPRESSION: 1. Chronic cholelithiasis. WES sign by ultrasound indicating a gallbladder contracted around numerous stones. 2. No evidence of acute cholecystitis, but increased CBD diameter from prior studies, now  8-9 mm. No intrahepatic biliary ductal dilatation, but Choledocholithiasis is difficult to exclude. Query hyperbilirubinemia. Electronically Signed   By: [name] M.D.   On: 05/14/2022 04:28     PROCEDURES:  Critical Care performed: No  .1-3 Lead EKG Interpretation  Performed by: Irean Hong, MD Authorized by: Irean Hong, MD     Interpretation: normal     ECG rate:  60   ECG rate assessment: normal     Rhythm: sinus rhythm     Ectopy: none     Conduction: normal   Comments:     Patient placed on cardiac monitor to evaluate for arrhythmias    MEDICATIONS ORDERED IN ED: Medications  ketorolac (TORADOL) 30 MG/ML injection 15 mg (has no administration in time range)  oxyCODONE-acetaminophen (PERCOCET/ROXICET) 5-325 MG per tablet 1 tablet (has no administration in time range)  HYDROmorphone (DILAUDID) injection 0.5 mg (0.5 mg Intravenous Given 05/14/22 0315)  ondansetron (ZOFRAN) injection 4 mg (4 mg Intravenous Given 05/14/22 0315)  sodium chloride 0.9 % bolus 1,000 mL (0 mLs Intravenous Stopped 05/14/22 0356)  famotidine (PEPCID) IVPB 20 mg premix (0 mg Intravenous Stopped 05/14/22 0356)     IMPRESSION / MDM / ASSESSMENT AND PLAN / ED COURSE  I reviewed the triage vital signs and the nursing notes.                             52 year old male presenting with right upper quadrant/flank pain. Differential diagnosis includes, but is not limited to, biliary disease (biliary colic, acute cholecystitis, cholangitis, choledocholithiasis, etc), intrathoracic causes for epigastric abdominal pain including ACS, gastritis, duodenitis, pancreatitis, small bowel or large bowel obstruction, abdominal aortic aneurysm, hernia, and ulcer(s).  I have personally reviewed patient's records and note a PCP office visit on 02/14/2021 for follow-up hypertension, obesity, diabetes and hypercholesterolemia.  Patient's presentation is most consistent with acute presentation with potential threat to life or  bodily function.  The patient is on the cardiac monitor to evaluate for evidence of arrhythmia and/or significant heart rate changes.  Will obtain cardiac panel including LFTs/lipase, right upper quadrant abdominal ultrasound.  Will administer IV fluids, IV Dilaudid for pain.  With IV Zofran to prevent nausea.  Will reassess.  Clinical Course as of 05/14/22 0558  Thu May 14, 2022  5366 Improved after IV Dilaudid, now returning.  Updated patient and spouse on lab results demonstrating normal WBC, LFTs and lipase.  Ultrasound demonstrates chronic cholelithiasis but difficult to exclude choledocholithiasis.  Will redose pain medications and proceed with MRCP. [JS]  0548 MRCP is negative for choledocholithiasis.  Patient is feeling better.  He asked nurse to hold repeat dose of Dilaudid.  Will administer IV ketorolac plus Percocet now.  Will discharge  home with general surgery follow-up.  Strict return precautions given.  Patient and spouse verbalized understanding and agree with plan of care. [JS]    Clinical Course User Index [JS] Irean Hong, MD     FINAL CLINICAL IMPRESSION(S) / ED DIAGNOSES   Final diagnoses:  Right flank pain  Right upper quadrant abdominal pain  Calculus of gallbladder without cholecystitis without obstruction  Biliary colic     Rx / DC Orders   ED Discharge Orders          Ordered    oxyCODONE-acetaminophen (PERCOCET/ROXICET) 5-325 MG tablet  Every 4 hours PRN        05/14/22 0550    ondansetron (ZOFRAN-ODT) 4 MG disintegrating tablet  Every 8 hours PRN        05/14/22 0550             Note:  This document was prepared using Dragon voice recognition software and may include unintentional dictation errors.   Irean Hong, MD 05/14/22 [id number]

## 2022-05-14 NOTE — ED Triage Notes (Signed)
Pt presents ambulatory to triage via POV with complaints of R side flank pain with associated R shoulder pain. Pt states he has this occur a few years ago and it was a "gallbladder attack".  Pt took  motrin PTA. A&Ox4 at this time. Denies dysuria, fevers, chills, CP or SOB.

## 2022-05-14 NOTE — Discharge Instructions (Addendum)
1. Take medicines as needed for pain & nausea (Percocet/Zofran #30). 2. Clear liquids x 12 hours, then bland diet x 1 week, then slowly advance diet as tolerated. Avoid fatty, greasy, spicy foods and drinks. 3. Return to the ER for worsening symptoms, persistent vomiting, fever, difficulty breathing or other concerns.  

## 2022-05-18 ENCOUNTER — Ambulatory Visit: Payer: Self-pay | Admitting: Surgery

## 2022-05-18 NOTE — H&P (View-Only) (Signed)
Subjective:   CC: Calculus of gallbladder without cholecystitis without obstruction [K80.20]  HPI:  Charles Bell is a 51 y.o. male who was referred by Sung for evaluation of above CC. Symptoms were first noted several days ago. Pain is sharp and intermittent, RUQ with radiation to back at times.  Associated with nothign, exacerbated by nothing.     Past Medical History:  has no past medical history on file.  Past Surgical History:  has a past surgical history that includes Colonoscopy (03/19/2021).  Family History: family history includes Diabetes type II in his father; Schizophrenia in his mother.  Social History:  reports that he has never smoked. He has never used smokeless tobacco. He reports that he does not currently use alcohol. He reports that he does not use drugs.  Current Medications: has a current medication list which includes the following prescription(s): aspirin, atorvastatin, blood glucose diagnostic, blood glucose meter, clotrimazole-betamethasone, diphenhydramine, hydrochlorothiazide, lancets, losartan, metformin, ondansetron, valacyclovir, and tramadol.  Allergies:  Allergies as of 05/18/2022   (No Known Allergies)    ROS:  A 15 point review of systems was performed and pertinent positives and negatives noted in HPI    Objective:     BP 135/76   Pulse 62   Ht 175.3 cm (5' 9")   Wt (!) 109.3 kg (241 lb)   BMI 35.59 kg/m    Constitutional :  No distress, cooperative, alert  Lymphatics/Throat:  Supple with no lymphadenopathy  Respiratory:  Clear to auscultation bilaterally  Cardiovascular:  Regular rate and rhythm  Gastrointestinal: Soft, minor TTP RUQ, non-distended, no organomegaly.  Musculoskeletal: Steady gait and movement  Skin: Cool and moist  Psychiatric: Normal affect, non-agitated, not confused       LABS:  LFTs WNL   RADS: CLINICAL DATA:  51-year-old male with right upper quadrant pain.   EXAM:  ULTRASOUND ABDOMEN LIMITED RIGHT  UPPER QUADRANT   COMPARISON:  CT Abdomen and Pelvis 06/02/2020. Ultrasound  06/26/2017.   FINDINGS:  Gallbladder:   Chronic cholelithiasis. Individual gallstones size estimated up to  1.2 cm. And most ultrasound images today are a wall-echo-shadow sign  (Wes sign). Wall thickness appears to remain normal to 2 mm. No  sonographic Murphy sign elicited.   Common bile duct:   Diameter: 8-9 mm (versus 6-7 mm in 2019 and on the prior CT. No  filling defect in the visible duct.   Liver:   Liver echogenicity at the upper limits of normal (image 62). No  intrahepatic biliary ductal dilatation or liver lesion identified.  Portal vein is patent on color Doppler imaging with normal direction  of blood flow towards the liver.   Other: Negative visible right kidney.  No free fluid.   IMPRESSION:  1. Chronic cholelithiasis. WES sign by ultrasound indicating a  gallbladder contracted around numerous stones.   2. No evidence of acute cholecystitis, but increased CBD diameter  from prior studies, now 8-9 mm.  No intrahepatic biliary ductal dilatation, but Choledocholithiasis  is difficult to exclude. Query hyperbilirubinemia.    Electronically Signed    By: H  Hall M.D.    On: 05/14/2022 04:28   CLINICAL DATA:  51-year-old male with history of cholelithiasis and  abdominal pain.   EXAM:  MRI ABDOMEN WITHOUT CONTRAST  (INCLUDING MRCP)   TECHNIQUE:  Multiplanar multisequence MR imaging of the abdomen was performed.  Heavily T2-weighted images of the biliary and pancreatic ducts were  obtained, and three-dimensional MRCP images were rendered by post    processing.   COMPARISON: No prior abdominal MRI. Abdominal ultrasound  05/14/2022. CT of the abdomen and pelvis without contrast  06/02/2020.   FINDINGS:  Comment: Today's study is limited for detection and characterization  of visceral and/or vascular lesions by lack of IV gadolinium.   Lower chest: Unremarkable.    Hepatobiliary: No discrete cystic or solid hepatic lesions are  confidently identified on today's noncontrast study. There are  multiple filling defects within the gallbladder which appears  contracted around these filling defects, compatible with gallstones.  Gallbladder wall does not appear thickened. No pericholecystic fluid  or surrounding inflammatory changes. No intra or extrahepatic  biliary ductal dilatation noted on MRCP images. Common bile duct is  upper limits of normal in size measuring 6 mm in the porta hepatis.  No filling defect in the common bile duct to suggest  choledocholithiasis.   Pancreas: No definite pancreatic mass or peripancreatic fluid  collections or inflammatory changes noted on today's noncontrast  examination. No pancreatic ductal dilatation noted on MRCP images.   Spleen:  Unremarkable.   Adrenals/Urinary Tract: Several T1 hypointense, T2 hyperintense  lesions in the left kidney, incompletely characterize without IV  gadolinium, but likely to represent cysts, largest of which extends  medially off the upper pole of the left kidney measuring up to 2.1 x  1.4 cm (axial image 20 of series 4). One of these lesions in the  upper pole of the left kidney (axial image 15 of series 4) has a  single thin internal septation, indicating a minimally complex  cystic lesion. These lesions are minimally increased in size  compared to prior CT of the abdomen and pelvis 2019, suggesting  benign lesions (no imaging follow-up recommended). Right kidney and  bilateral adrenal glands are otherwise normal in appearance. No  hydroureteronephrosis in the visualized portions of the abdomen.   Stomach/Bowel: Visualized portions are unremarkable.   Vascular/Lymphatic: No aneurysm identified in the visualized  abdominal vasculature. No lymphadenopathy noted in the abdomen.   Other: No significant volume of ascites noted in the visualized  portions of the peritoneal cavity.    Musculoskeletal: No aggressive appearing osseous lesions are noted  in the visualized portions of the skeleton.   IMPRESSION:  1. Study is positive for cholelithiasis, but there are no imaging  findings to suggest an acute cholecystitis.  2. No choledocholithiasis or findings to suggest biliary tract  obstruction.  3. Additional incidental findings, as above.    Electronically Signed    By: Daniel  Entrikin M.D.    On: 05/14/2022 05:42  Assessment:      Calculus of gallbladder without cholecystitis without obstruction [K80.20] history consistent with biliary colic/chronic cholecystits.  Plan:     1. Calculus of gallbladder without cholecystitis without obstruction [K80.20] Discussed the risk of surgery including post-op infxn, seroma, biloma, chronic pain, poor-delayed wound healing, retained gallstone, conversion to open procedure, post-op SBO or ileus, and need for additional procedures to address said risks.  The risks of general anesthetic including MI, CVA, sudden death or even reaction to anesthetic medications also discussed. Alternatives include continued observation.  Benefits include possible symptom relief, prevention of complications including acute cholecystitis, pancreatitis.  Typical post operative recovery of 3-5 days rest, continued pain in area and incision sites, possible loose stools up to 4-6 weeks, also discussed.  ED return precautions given for sudden increase in RUQ pain, with possible accompanying fever, nausea, and/or vomiting.  The patient understands the risks, any and all questions were answered   to the patient's satisfaction.  2. Patient has elected to proceed with surgical treatment. Procedure will be scheduled.  Written consent was obtained.robotic assisted laparoscopic.  Switch pain meds to ultram from percocet secondary to nausea in the meantime.  labs/images/medications/previous chart entries reviewed personally and relevant changes/updates  noted above. 

## 2022-05-18 NOTE — H&P (Signed)
Subjective:   CC: Calculus of gallbladder without cholecystitis without obstruction [K80.20]  HPI:  Charles Bell is a 52 y.o. male who was referred by Charles Bell for evaluation of above CC. Symptoms were first noted several days ago. Pain is sharp and intermittent, RUQ with radiation to back at times.  Associated with nothign, exacerbated by nothing.     Past Medical History:  has no past medical history on file.  Past Surgical History:  has a past surgical history that includes Colonoscopy (03/19/2021).  Family History: family history includes Diabetes type II in his father; Schizophrenia in his mother.  Social History:  reports that he has never smoked. He has never used smokeless tobacco. He reports that he does not currently use alcohol. He reports that he does not use drugs.  Current Medications: has a current medication list which includes the following prescription(s): aspirin, atorvastatin, blood glucose diagnostic, blood glucose meter, clotrimazole-betamethasone, diphenhydramine, hydrochlorothiazide, lancets, losartan, metformin, ondansetron, valacyclovir, and tramadol.  Allergies:  Allergies as of 05/18/2022   (No Known Allergies)    ROS:  A 15 point review of systems was performed and pertinent positives and negatives noted in HPI    Objective:     BP 135/76   Pulse 62   Ht 175.3 cm ( )   Wt (!) 109.3 kg (241 lb)   BMI 35.59 kg/m    Constitutional :  No distress, cooperative, alert  Lymphatics/Throat:  Supple with no lymphadenopathy  Respiratory:  Clear to auscultation bilaterally  Cardiovascular:  Regular rate and rhythm  Gastrointestinal: Soft, minor TTP RUQ, non-distended, no organomegaly.  Musculoskeletal: Steady gait and movement  Skin: Cool and moist  Psychiatric: Normal affect, non-agitated, not confused       LABS:  LFTs WNL   RADS: CLINICAL DATA:  52 year old male with right upper quadrant pain.   EXAM:  ULTRASOUND ABDOMEN LIMITED RIGHT  UPPER QUADRANT   COMPARISON:  CT Abdomen and Pelvis 06/02/2020. Ultrasound  06/26/2017.   FINDINGS:  Gallbladder:   Chronic cholelithiasis. Individual gallstones size estimated up to  1.2 cm. And most ultrasound images today are a wall-echo-shadow sign  (Wes sign). Wall thickness appears to remain normal to 2 mm. No  sonographic Murphy sign elicited.   Common bile duct:   Diameter: 8-9 mm (versus 6-7 mm in 2019 and on the prior CT. No  filling defect in the visible duct.   Liver:   Liver echogenicity at the upper limits of normal (image 62). No  intrahepatic biliary ductal dilatation or liver lesion identified.  Portal vein is patent on color Doppler imaging with normal direction  of blood flow towards the liver.   Other: Negative visible right kidney.  No free fluid.   IMPRESSION:  1. Chronic cholelithiasis. WES sign by ultrasound indicating a  gallbladder contracted around numerous stones.   2. No evidence of acute cholecystitis, but increased CBD diameter  from prior studies, now 8-9 mm.  No intrahepatic biliary ductal dilatation, but Choledocholithiasis  is difficult to exclude. Query hyperbilirubinemia.    Electronically Signed    By: Charles Bell M.D.    On: 05/14/2022 04:28   CLINICAL DATA:  52 year old male with history of cholelithiasis and  abdominal pain.   EXAM:  MRI ABDOMEN WITHOUT CONTRAST  (INCLUDING MRCP)   TECHNIQUE:  Multiplanar multisequence MR imaging of the abdomen was performed.  Heavily T2-weighted images of the biliary and pancreatic ducts were  obtained, and three-dimensional MRCP images were rendered by post  processing.   COMPARISON: No prior abdominal MRI. Abdominal ultrasound  05/14/2022. CT of the abdomen and pelvis without contrast  06/02/2020.   FINDINGS:  Comment: Today's study is limited for detection and characterization  of visceral and/or vascular lesions by lack of IV gadolinium.   Lower chest: Unremarkable.    Hepatobiliary: No discrete cystic or solid hepatic lesions are  confidently identified on today's noncontrast study. There are  multiple filling defects within the gallbladder which appears  contracted around these filling defects, compatible with gallstones.  Gallbladder wall does not appear thickened. No pericholecystic fluid  or surrounding inflammatory changes. No intra or extrahepatic  biliary ductal dilatation noted on MRCP images. Common bile duct is  upper limits of normal in size measuring 6 mm in the porta hepatis.  No filling defect in the common bile duct to suggest  choledocholithiasis.   Pancreas: No definite pancreatic mass or peripancreatic fluid  collections or inflammatory changes noted on today's noncontrast  examination. No pancreatic ductal dilatation noted on MRCP images.   Spleen:  Unremarkable.   Adrenals/Urinary Tract: Several T1 hypointense, T2 hyperintense  lesions in the left kidney, incompletely characterize without IV  gadolinium, but likely to represent cysts, largest of which extends  medially off the upper pole of the left kidney measuring up to 2.1 x  1.4 cm (axial image 20 of series 4). One of these lesions in the  upper pole of the left kidney (axial image 15 of series 4) has a  single thin internal septation, indicating a minimally complex  cystic lesion. These lesions are minimally increased in size  compared to prior CT of the abdomen and pelvis 2019, suggesting  benign lesions (no imaging follow-up recommended). Right kidney and  bilateral adrenal glands are otherwise normal in appearance. No  hydroureteronephrosis in the visualized portions of the abdomen.   Stomach/Bowel: Visualized portions are unremarkable.   Vascular/Lymphatic: No aneurysm identified in the visualized  abdominal vasculature. No lymphadenopathy noted in the abdomen.   Other: No significant volume of ascites noted in the visualized  portions of the peritoneal cavity.    Musculoskeletal: No aggressive appearing osseous lesions are noted  in the visualized portions of the skeleton.   IMPRESSION:  1. Study is positive for cholelithiasis, but there are no imaging  findings to suggest an acute cholecystitis.  2. No choledocholithiasis or findings to suggest biliary tract  obstruction.  3. Additional incidental findings, as above.    Electronically Signed    By: Trudie Reed M.D.    On: 05/14/2022 05:42  Assessment:      Calculus of gallbladder without cholecystitis without obstruction [K80.20] history consistent with biliary colic/chronic cholecystits.  Plan:     1. Calculus of gallbladder without cholecystitis without obstruction [K80.20] Discussed the risk of surgery including post-op infxn, seroma, biloma, chronic pain, poor-delayed wound healing, retained gallstone, conversion to open procedure, post-op SBO or ileus, and need for additional procedures to address said risks.  The risks of general anesthetic including MI, CVA, sudden death or even reaction to anesthetic medications also discussed. Alternatives include continued observation.  Benefits include possible symptom relief, prevention of complications including acute cholecystitis, pancreatitis.  Typical post operative recovery of 3-5 days rest, continued pain in area and incision sites, possible loose stools up to 4-6 weeks, also discussed.  ED return precautions given for sudden increase in RUQ pain, with possible accompanying fever, nausea, and/or vomiting.  The patient understands the risks, any and all questions were answered  to the patient's satisfaction.  2. Patient has elected to proceed with surgical treatment. Procedure will be scheduled.  Written consent was obtained.robotic assisted laparoscopic.  Switch pain meds to ultram from percocet secondary to nausea in the meantime.  labs/images/medications/previous chart entries reviewed personally and relevant changes/updates  noted above.

## 2022-05-21 ENCOUNTER — Inpatient Hospital Stay
Admission: RE | Admit: 2022-05-21 | Discharge: 2022-05-21 | Disposition: A | Discharge status: Home / Self Care | Payer: BC Managed Care – PPO | Source: Ambulatory Visit

## 2022-05-21 HISTORY — DX: Pure hypercholesterolemia, unspecified: E78.00

## 2022-05-21 HISTORY — DX: Essential (primary) hypertension: I10

## 2022-05-21 HISTORY — DX: Obesity, unspecified: E66.9

## 2022-05-21 HISTORY — DX: Type 2 diabetes mellitus without complications: E11.9

## 2022-05-21 HISTORY — DX: Calculus of gallbladder without cholecystitis without obstruction: K80.20

## 2022-05-21 NOTE — Patient Instructions (Addendum)
Your procedure is scheduled on: 05/29/22 - Friday Report to the Registration Desk on the 1st floor of the [facility]. To find out your arrival time, please call [phone] between 1PM - 3PM on: 05/28/22 - Thursday If your arrival time is 6:00 am, do not arrive before that time as the [facility] entrance doors do not open until 6:00 am.  REMEMBER: Instructions that are not followed completely may result in serious medical risk, up to and including death; or upon the discretion of your surgeon and anesthesiologist your surgery may need to be rescheduled.  Do not eat food after midnight the night before surgery.  No gum chewing or hard candies.  You may however, drink CLEAR liquids up to 2 hours before you are scheduled to arrive for your surgery. Do not drink anything within 2 hours of your scheduled arrival time.  Clear liquids include: - water  - apple juice without pulp - gatorade (not RED colors) - black coffee or tea (Do NOT add milk or creamers to the coffee or tea) Do NOT drink anything that is not on this list.    Stop taking beginning 05/22/22, One week prior to surgery:meloxicam [facility]  Stop Anti-inflammatories (NSAIDS) such as Advil, Aleve, Ibuprofen, Motrin, Naproxen, Naprosyn and Aspirin based products such as Excedrin, Goody's Powder, BC Powder.  Stop ANY OVER THE COUNTER supplements until after surgery.  You may take Tylenol if needed for pain up until the day of surgery.  Continue taking all prescribed medications with the exception of the following:  - stop taking metFORMIN (GLUCOPHAGE) beginning 05/27/22.  TAKE ONLY THESE MEDICATIONS THE MORNING OF SURGERY WITH A SIP OF WATER:  famotidine (PEPCID)  (take one the night before and one on the morning of surgery - helps to prevent nausea after surgery.) 2.  atorvastatin (LIPITOR)    No Alcohol for 24 hours before or after surgery.  No Smoking including e-cigarettes for 24 hours before surgery.  No chewable  tobacco products for at least 6 hours before surgery.  No nicotine patches on the day of surgery.  Do not use any "recreational" drugs for at least a week (preferably 2 weeks) before your surgery.  Please be advised that the combination of cocaine and anesthesia may have negative outcomes, up to and including death. If you test positive for cocaine, your surgery will be cancelled.  On the morning of surgery brush your teeth with toothpaste and water, you may rinse your mouth with mouthwash if you wish. Do not swallow any toothpaste or mouthwash.  Use CHG Soap or wipes as directed on instruction sheet.  Do not wear jewelry, make-up, hairpins, clips or nail polish.  Do not wear lotions, powders, or perfumes.   Do not shave body hair from the neck down 48 hours before surgery.  Contact lenses, hearing aids and dentures may not be worn into surgery.  Do not bring valuables to the hospital. [facility] is not responsible for any missing/lost belongings or valuables.   Bring your C-PAP to the hospital in case you may have to spend the night.   Notify your doctor if there is any change in your medical condition (cold, fever, infection).  Wear comfortable clothing (specific to your surgery type) to the hospital.  After surgery, you can help prevent lung complications by doing breathing exercises.  Take deep breaths and cough every 1-2 hours. Your doctor may order a device called an Incentive Spirometer to help you take deep breaths. When coughing  or sneezing, hold a pillow firmly against your incision with both hands. This is called "splinting." Doing this helps protect your incision. It also decreases belly discomfort.  If you are being admitted to the hospital overnight, leave your suitcase in the car. After surgery it may be brought to your room.  In case of increased patient census, it may be necessary for you, the patient, to continue your postoperative care in the Same Day Surgery  department.  If you are being discharged the day of surgery, you will not be allowed to drive home. You will need a responsible individual to drive you home and stay with you for 24 hours after surgery.   If you are taking public transportation, you will need to have a responsible individual with you.  Please call the Pre-admissions Testing Dept. at [phone] if you have any questions about these instructions.  Surgery Visitation Policy:  Patients having surgery or a procedure may have two visitors.  Children under the age of 68 must have an adult with them who is not the patient.  Inpatient Visitation:    Visiting hours are 7 a.m. to 8 p.m. Up to four visitors are allowed at one time in a patient room. The visitors may rotate out with other people during the day.  One visitor age 40 or older may stay with the patient overnight and must be in the room by 8 p.m.    Preparing for Surgery with CHLORHEXIDINE GLUCONATE (CHG) Soap  Chlorhexidine Gluconate (CHG) Soap  o An antiseptic cleaner that kills germs and bonds with the skin to continue killing germs even after washing  o Used for showering the night before surgery and morning of surgery  Before surgery, you can play an important role by reducing the number of germs on your skin.  CHG (Chlorhexidine gluconate) soap is an antiseptic cleanser which kills germs and bonds with the skin to continue killing germs even after washing.  Please do not use if you have an allergy to CHG or antibacterial soaps. If your skin becomes reddened/irritated stop using the CHG.  1. Shower the NIGHT BEFORE SURGERY and the MORNING OF SURGERY with CHG soap.  2. If you choose to wash your hair, wash your hair first as usual with your normal shampoo.  3. After shampooing, rinse your hair and body thoroughly to remove the shampoo.  4. Use CHG as you would any other liquid soap. You can apply CHG directly to the skin and wash gently with a scrungie  or a clean washcloth.  5. Apply the CHG soap to your body only from the neck down. Do not use on open wounds or open sores. Avoid contact with your eyes, ears, mouth, and genitals (private parts). Wash face and genitals (private parts) with your normal soap.  6. Wash thoroughly, paying special attention to the area where your surgery will be performed.  7. Thoroughly rinse your body with warm water.  8. Do not shower/wash with your normal soap after using and rinsing off the CHG soap.  9. Pat yourself dry with a clean towel.  10. Wear clean pajamas to bed the night before surgery.  12. Place clean sheets on your bed the night of your first shower and do not sleep with pets.  13. Shower again with the CHG soap on the day of surgery prior to arriving at the hospital.  14. Do not apply any deodorants/lotions/powders.  15. Please wear clean clothes to the hospital.

## 2022-05-22 ENCOUNTER — Inpatient Hospital Stay
Admission: RE | Admit: 2022-05-22 | Discharge: 2022-05-22 | Disposition: A | Discharge status: Home / Self Care | Payer: BC Managed Care – PPO | Source: Ambulatory Visit

## 2022-05-22 NOTE — Pre-Procedure Instructions (Signed)
Multiple attempts made to reach patient for his scheduled PAT phone call on 05/21/22 and 05/22/22, messages left for him and his wife on both days with no return calls.Dr. Geoffery Lyons office made aware.

## 2022-05-26 ENCOUNTER — Encounter
Admission: RE | Admit: 2022-05-26 | Discharge: 2022-05-26 | Disposition: A | Discharge status: Home / Self Care | Payer: BC Managed Care – PPO | Source: Ambulatory Visit | Attending: Surgery | Admitting: Surgery

## 2022-05-26 ENCOUNTER — Other Ambulatory Visit: Payer: Self-pay

## 2022-05-26 DIAGNOSIS — Z01812 Encounter for preprocedural laboratory examination: Secondary | ICD-10-CM

## 2022-05-26 HISTORY — DX: Gastro-esophageal reflux disease without esophagitis: K21.9

## 2022-05-26 NOTE — Patient Instructions (Signed)
Your procedure is scheduled on: 05/29/22 - Friday Report to the Registration Desk on the 1st floor of the [facility]. To find out your arrival time, please call [phone] between 1PM - 3PM on: 05/28/22 - Thursday If your arrival time is 6:00 am, do not arrive before that time as the Medical [facility] entrance doors do not open until 6:00 am.  REMEMBER: Instructions that are not followed completely may result in serious medical risk, up to and including death; or upon the discretion of your surgeon and anesthesiologist your surgery may need to be rescheduled.  Do not eat food after midnight the night before surgery.  No gum chewing or hard candies.  You may however, drink CLEAR liquids up to 2 hours before you are scheduled to arrive for your surgery. Do not drink anything within 2 hours of your scheduled arrival time.  Clear liquids include: - water   One week prior to surgery:meloxicam [facility])  Stop Anti-inflammatories (NSAIDS) such as Advil, Aleve, Ibuprofen, Motrin, Naproxen, Naprosyn and Aspirin based products such as Excedrin, Goody's Powder, BC Powder.  Stop ANY OVER THE COUNTER supplements until after surgery.  You may however, continue to take Tylenol if needed for pain up until the day of surgery.  Continue taking all prescribed medications with the exception of the following:  metFORMIN (GLUCOPHAGE)    TAKE ONLY THESE MEDICATIONS THE MORNING OF SURGERY WITH A SIP OF WATER:  famotidine (PEPCID) - (take one the night before and one on the morning of surgery - helps to prevent nausea after surgery.)   No Alcohol for 24 hours before or after surgery.  No Smoking including e-cigarettes for 24 hours before surgery.  No chewable tobacco products for at least 6 hours before surgery.  No nicotine patches on the day of surgery.  Do not use any "recreational" drugs for at least a week (preferably 2 weeks) before your surgery.  Please be advised that the combination of cocaine  and anesthesia may have negative outcomes, up to and including death. If you test positive for cocaine, your surgery will be cancelled.  On the morning of surgery brush your teeth with toothpaste and water, you may rinse your mouth with mouthwash if you wish. Do not swallow any toothpaste or mouthwash.  Use CHG Soap or wipes as directed on instruction sheet.  Do not wear jewelry, make-up, hairpins, clips or nail polish.  Do not wear lotions, powders, or perfumes.   Do not shave body hair from the neck down 48 hours before surgery.  Contact lenses, hearing aids and dentures may not be worn into surgery.  Do not bring valuables to the hospital. [facility] is not responsible for any missing/lost belongings or valuables.   Notify your doctor if there is any change in your medical condition (cold, fever, infection).  Wear comfortable clothing (specific to your surgery type) to the hospital.  After surgery, you can help prevent lung complications by doing breathing exercises.  Take deep breaths and cough every 1-2 hours. Your doctor may order a device called an Incentive Spirometer to help you take deep breaths. When coughing or sneezing, hold a pillow firmly against your incision with both hands. This is called "splinting." Doing this helps protect your incision. It also decreases belly discomfort.  If you are being admitted to the hospital overnight, leave your suitcase in the car. After surgery it may be brought to your room.  In case of increased patient census, it may be necessary for  you, the patient, to continue your postoperative care in the Same Day Surgery department.  If you are being discharged the day of surgery, you will not be allowed to drive home. You will need a responsible individual to drive you home and stay with you for 24 hours after surgery.   If you are taking public transportation, you will need to have a responsible individual with you.  Please call the  Pre-admissions Testing Dept. at [phone] if you have any questions about these instructions.  Surgery Visitation Policy:  Patients having surgery or a procedure may have two visitors.  Children under the age of 54 must have an adult with them who is not the patient.  Inpatient Visitation:    Visiting hours are 7 a.m. to 8 p.m. Up to four visitors are allowed at one time in a patient room. The visitors may rotate out with other people during the day.  One visitor age 7 or older may stay with the patient overnight and must be in the room by 8 p.m.    Preparing for Surgery with CHLORHEXIDINE GLUCONATE (CHG) Soap  Chlorhexidine Gluconate (CHG) Soap  o An antiseptic cleaner that kills germs and bonds with the skin to continue killing germs even after washing  o Used for showering the night before surgery and morning of surgery  Before surgery, you can play an important role by reducing the number of germs on your skin.  CHG (Chlorhexidine gluconate) soap is an antiseptic cleanser which kills germs and bonds with the skin to continue killing germs even after washing.  Please do not use if you have an allergy to CHG or antibacterial soaps. If your skin becomes reddened/irritated stop using the CHG.  1. Shower the NIGHT BEFORE SURGERY and the MORNING OF SURGERY with CHG soap.  2. If you choose to wash your hair, wash your hair first as usual with your normal shampoo.  3. After shampooing, rinse your hair and body thoroughly to remove the shampoo.  4. Use CHG as you would any other liquid soap. You can apply CHG directly to the skin and wash gently with a scrungie or a clean washcloth.  5. Apply the CHG soap to your body only from the neck down. Do not use on open wounds or open sores. Avoid contact with your eyes, ears, mouth, and genitals (private parts). Wash face and genitals (private parts) with your normal soap.  6. Wash thoroughly, paying special attention to the area where  your surgery will be performed.  7. Thoroughly rinse your body with warm water.  8. Do not shower/wash with your normal soap after using and rinsing off the CHG soap.  9. Pat yourself dry with a clean towel.  10. Wear clean pajamas to bed the night before surgery.  12. Place clean sheets on your bed the night of your first shower and do not sleep with pets.  13. Shower again with the CHG soap on the day of surgery prior to arriving at the hospital.  14. Do not apply any deodorants/lotions/powders.  15. Please wear clean clothes to the hospital.

## 2022-05-29 ENCOUNTER — Other Ambulatory Visit: Payer: Self-pay

## 2022-05-29 ENCOUNTER — Encounter: Payer: Self-pay | Admitting: Surgery

## 2022-05-29 ENCOUNTER — Ambulatory Visit: Payer: BC Managed Care – PPO | Admitting: Certified Registered Nurse Anesthetist

## 2022-05-29 ENCOUNTER — Encounter
Admission: RE | Disposition: A | Discharge status: Home / Self Care | Payer: Self-pay | Source: Home / Self Care | Attending: Surgery

## 2022-05-29 ENCOUNTER — Ambulatory Visit
Admission: RE | Admit: 2022-05-29 | Discharge: 2022-05-29 | Disposition: A | Discharge status: Home / Self Care | Payer: BC Managed Care – PPO | Attending: Surgery | Admitting: Surgery

## 2022-05-29 DIAGNOSIS — K819 Cholecystitis, unspecified: Secondary | ICD-10-CM

## 2022-05-29 DIAGNOSIS — K8064 Calculus of gallbladder and bile duct with chronic cholecystitis without obstruction: Secondary | ICD-10-CM | POA: Insufficient documentation

## 2022-05-29 DIAGNOSIS — Z01812 Encounter for preprocedural laboratory examination: Secondary | ICD-10-CM

## 2022-05-29 DIAGNOSIS — K8050 Calculus of bile duct without cholangitis or cholecystitis without obstruction: Secondary | ICD-10-CM | POA: Diagnosis present

## 2022-05-29 LAB — GLUCOSE, CAPILLARY
Glucose-Capillary: 115 mg/dL — ABNORMAL HIGH (ref 70–99)
Glucose-Capillary: 121 mg/dL — ABNORMAL HIGH (ref 70–99)

## 2022-05-29 SURGERY — CHOLECYSTECTOMY, ROBOT-ASSISTED, LAPAROSCOPIC
Anesthesia: General | Site: Abdomen

## 2022-05-29 MED ORDER — DROPERIDOL 2.5 MG/ML IJ SOLN
0.6250 mg | Freq: Once | INTRAMUSCULAR | Status: AC
  Administered 2022-05-29: 0.625 mg via INTRAVENOUS

## 2022-05-29 MED ORDER — MIDAZOLAM HCL 2 MG/2ML IJ SOLN
INTRAMUSCULAR | Status: DC | PRN
  Administered 2022-05-29 (×2): 1 mg via INTRAVENOUS

## 2022-05-29 MED ORDER — MIDAZOLAM HCL 2 MG/2ML IJ SOLN
INTRAMUSCULAR | Status: AC
  Filled 2022-05-29: qty 2

## 2022-05-29 MED ORDER — DEXAMETHASONE SODIUM PHOSPHATE 10 MG/ML IJ SOLN
INTRAMUSCULAR | Status: DC | PRN
  Administered 2022-05-29: 5 mg via INTRAVENOUS

## 2022-05-29 MED ORDER — INDOCYANINE GREEN 25 MG IV SOLR
1.2500 mg | Freq: Once | INTRAVENOUS | Status: AC
  Administered 2022-05-29: 1.25 mg via INTRAVENOUS
  Filled 2022-05-29: qty 0.5

## 2022-05-29 MED ORDER — LIDOCAINE HCL (CARDIAC) PF 100 MG/5ML IV SOSY
PREFILLED_SYRINGE | INTRAVENOUS | Status: DC | PRN
  Administered 2022-05-29: 100 mg via INTRAVENOUS

## 2022-05-29 MED ORDER — CHLORHEXIDINE GLUCONATE 0.12 % MT SOLN
15.0000 mL | Freq: Once | OROMUCOSAL | Status: AC
  Administered 2022-05-29: 15 mL via OROMUCOSAL

## 2022-05-29 MED ORDER — OXYCODONE HCL 5 MG PO TABS
5.0000 mg | ORAL_TABLET | Freq: Once | ORAL | Status: AC | PRN
  Administered 2022-05-29: 5 mg via ORAL

## 2022-05-29 MED ORDER — ORAL CARE MOUTH RINSE: 15.0000 mL | Freq: Once | OROMUCOSAL | Status: AC

## 2022-05-29 MED ORDER — CHLORHEXIDINE GLUCONATE CLOTH 2 % EX PADS
6.0000 | MEDICATED_PAD | Freq: Once | CUTANEOUS | Status: AC
  Administered 2022-05-29: 6 via TOPICAL

## 2022-05-29 MED ORDER — KETOROLAC TROMETHAMINE 30 MG/ML IJ SOLN
INTRAMUSCULAR | Status: DC | PRN
  Administered 2022-05-29: 30 mg via INTRAVENOUS

## 2022-05-29 MED ORDER — FENTANYL CITRATE (PF) 100 MCG/2ML IJ SOLN: 25.0000 ug | INTRAMUSCULAR | Status: DC | PRN

## 2022-05-29 MED ORDER — FEXOFENADINE-PSEUDOEPHED ER 180-240 MG PO TB24: 1.0000 | ORAL_TABLET | ORAL | Status: AC | PRN

## 2022-05-29 MED ORDER — LIDOCAINE-EPINEPHRINE (PF) 1 %-1:200000 IJ SOLN
INTRAMUSCULAR | Status: DC | PRN
  Administered 2022-05-29: 20 mL via INTRAMUSCULAR

## 2022-05-29 MED ORDER — DOCUSATE SODIUM 100 MG PO CAPS: 100.0000 mg | ORAL_CAPSULE | Freq: Two times a day (BID) | ORAL | 0 refills | Status: AC | PRN

## 2022-05-29 MED ORDER — PROPOFOL 10 MG/ML IV BOLUS
INTRAVENOUS | Status: DC | PRN
  Administered 2022-05-29: 200 mg via INTRAVENOUS

## 2022-05-29 MED ORDER — CEFAZOLIN SODIUM-DEXTROSE 2-4 GM/100ML-% IV SOLN
2.0000 g | INTRAVENOUS | Status: AC
  Administered 2022-05-29: 2 g via INTRAVENOUS

## 2022-05-29 MED ORDER — SODIUM CHLORIDE 0.9 % IV SOLN: INTRAVENOUS | Status: DC

## 2022-05-29 MED ORDER — DROPERIDOL 2.5 MG/ML IJ SOLN
INTRAMUSCULAR | Status: AC
  Filled 2022-05-29: qty 2

## 2022-05-29 MED ORDER — ACETAMINOPHEN 10 MG/ML IV SOLN
INTRAVENOUS | Status: AC
  Filled 2022-05-29: qty 100

## 2022-05-29 MED ORDER — FENTANYL CITRATE (PF) 100 MCG/2ML IJ SOLN
INTRAMUSCULAR | Status: DC | PRN
  Administered 2022-05-29: 100 ug via INTRAVENOUS

## 2022-05-29 MED ORDER — LIDOCAINE-EPINEPHRINE (PF) 1 %-1:200000 IJ SOLN
INTRAMUSCULAR | Status: AC
  Filled 2022-05-29: qty 30

## 2022-05-29 MED ORDER — MELOXICAM 15 MG PO TABS: 15.0000 mg | ORAL_TABLET | ORAL | Status: AC | PRN

## 2022-05-29 MED ORDER — OXYCODONE HCL 5 MG PO TABS
ORAL_TABLET | ORAL | Status: AC
  Filled 2022-05-29: qty 1

## 2022-05-29 MED ORDER — CEFAZOLIN SODIUM-DEXTROSE 2-4 GM/100ML-% IV SOLN
INTRAVENOUS | Status: AC
  Filled 2022-05-29: qty 100

## 2022-05-29 MED ORDER — ACETAMINOPHEN 10 MG/ML IV SOLN
INTRAVENOUS | Status: DC | PRN
  Administered 2022-05-29: 1000 mg via INTRAVENOUS

## 2022-05-29 MED ORDER — ROCURONIUM BROMIDE 100 MG/10ML IV SOLN
INTRAVENOUS | Status: DC | PRN
  Administered 2022-05-29: 10 mg via INTRAVENOUS
  Administered 2022-05-29: 60 mg via INTRAVENOUS

## 2022-05-29 MED ORDER — PROPOFOL 10 MG/ML IV BOLUS
INTRAVENOUS | Status: AC
  Filled 2022-05-29: qty 40

## 2022-05-29 MED ORDER — BUPIVACAINE HCL (PF) 0.5 % IJ SOLN
INTRAMUSCULAR | Status: AC
  Filled 2022-05-29: qty 30

## 2022-05-29 MED ORDER — LACTATED RINGERS IV SOLN: INTRAVENOUS | Status: DC | PRN

## 2022-05-29 MED ORDER — SUGAMMADEX SODIUM 200 MG/2ML IV SOLN
INTRAVENOUS | Status: DC | PRN
  Administered 2022-05-29: 50 mg via INTRAVENOUS
  Administered 2022-05-29: 200 mg via INTRAVENOUS

## 2022-05-29 MED ORDER — FLUTICASONE PROPIONATE 50 MCG/ACT NA SUSP: 2.0000 | NASAL | Status: AC | PRN

## 2022-05-29 MED ORDER — TRAMADOL HCL 50 MG PO TABS: 50.0000 mg | ORAL_TABLET | Freq: Four times a day (QID) | ORAL | 0 refills | Status: AC | PRN

## 2022-05-29 MED ORDER — OXYCODONE HCL 5 MG/5ML PO SOLN: 5.0000 mg | Freq: Once | ORAL | Status: AC | PRN

## 2022-05-29 MED ORDER — FENTANYL CITRATE (PF) 100 MCG/2ML IJ SOLN
INTRAMUSCULAR | Status: AC
  Filled 2022-05-29: qty 2

## 2022-05-29 MED ORDER — ONDANSETRON HCL 4 MG/2ML IJ SOLN
INTRAMUSCULAR | Status: DC | PRN
  Administered 2022-05-29: 4 mg via INTRAVENOUS

## 2022-05-29 MED ORDER — LIDOCAINE HCL (PF) 2 % IJ SOLN
INTRAMUSCULAR | Status: AC
  Filled 2022-05-29: qty 5

## 2022-05-29 SURGICAL SUPPLY — 52 items
ADH SKN CLS APL DERMABOND .7 (GAUZE/BANDAGES/DRESSINGS) ×2
ANCHOR TIS RET SYS 235ML (MISCELLANEOUS) ×3 IMPLANT
BAG PRESSURE INF REUSE 1000 (BAG) IMPLANT
BAG TISS RTRVL C235 10X14 (MISCELLANEOUS) ×2
BLADE SURG SZ11 CARB STEEL (BLADE) ×3 IMPLANT
CATH REDDICK CHOLANGI 4FR 50CM (CATHETERS) IMPLANT
CAUTERY HOOK MNPLR 1.6 DVNC XI (INSTRUMENTS) ×2 IMPLANT
CLIP LIGATING HEMO O LOK GREEN (MISCELLANEOUS) ×3 IMPLANT
DERMABOND ADVANCED .7 DNX12 (GAUZE/BANDAGES/DRESSINGS) ×3 IMPLANT
DRAPE ARM DVNC X/XI (DISPOSABLE) ×12 IMPLANT
DRAPE C-ARM XRAY 36X54 (DRAPES) IMPLANT
DRAPE COLUMN DVNC XI (DISPOSABLE) ×3 IMPLANT
ELECT CAUTERY BLADE 6.4 (BLADE) ×3 IMPLANT
ELECT REM PT RETURN 9FT ADLT (ELECTROSURGICAL) ×2
ELECTRODE REM PT RTRN 9FT ADLT (ELECTROSURGICAL) ×2 IMPLANT
FORCEPS BPLR 8 MD DVNC XI (FORCEP) ×2 IMPLANT
FORCEPS BPLR FENES DVNC XI (FORCEP) ×3 IMPLANT
FORCEPS PROGRASP DVNC XI (FORCEP) ×3 IMPLANT
GLOVE BIOGEL PI IND STRL 7.0 (GLOVE) ×6 IMPLANT
GLOVE SURG SYN 6.5 ES PF (GLOVE) ×4 IMPLANT
GLOVE SURG SYN 6.5 PF PI (GLOVE) ×6 IMPLANT
GOWN STRL REUS W/ TWL LRG LVL3 (GOWN DISPOSABLE) ×6 IMPLANT
GOWN STRL REUS W/TWL LRG LVL3 (GOWN DISPOSABLE) ×6
GRASPER SUT TROCAR 14GX15 (MISCELLANEOUS) IMPLANT
IRRIGATOR SUCT 8 DISP DVNC XI (IRRIGATION / IRRIGATOR) IMPLANT
IV NS 1000ML (IV SOLUTION) ×2
IV NS 1000ML BAXH (IV SOLUTION) IMPLANT
KIT TURNOVER KIT A (KITS) ×2 IMPLANT
LABEL OR SOLS (LABEL) ×3 IMPLANT
MANIFOLD NEPTUNE II (INSTRUMENTS) ×3 IMPLANT
NDL HYPO 22X1.5 SAFETY MO (MISCELLANEOUS) ×2 IMPLANT
NDL INSUFFLATION 14GA 120MM (NEEDLE) ×3 IMPLANT
NEEDLE HYPO 22X1.5 SAFETY MO (MISCELLANEOUS) ×2 IMPLANT
NEEDLE INSUFFLATION 14GA 120MM (NEEDLE) ×2 IMPLANT
NS IRRIG 500ML POUR BTL (IV SOLUTION) ×3 IMPLANT
OBTURATOR OPTICAL STND 8 DVNC (TROCAR) ×2
OBTURATOR OPTICALSTD 8 DVNC (TROCAR) ×3 IMPLANT
PACK LAP CHOLECYSTECTOMY (MISCELLANEOUS) ×3 IMPLANT
PENCIL SMOKE EVACUATOR (MISCELLANEOUS) ×3 IMPLANT
SEAL UNIV 5-12 XI (MISCELLANEOUS) ×12 IMPLANT
SET TUBE SMOKE EVAC HIGH FLOW (TUBING) ×3 IMPLANT
SOL ELECTROSURG ANTI STICK (MISCELLANEOUS) ×2
SOLUTION ELECTROSURG ANTI STCK (MISCELLANEOUS) ×3 IMPLANT
SPIKE FLUID TRANSFER (MISCELLANEOUS) ×4 IMPLANT
SUT MNCRL 4-0 (SUTURE) ×4
SUT MNCRL 4-0 27XMFL (SUTURE) ×4
SUT VICRYL 0 UR6 27IN ABS (SUTURE) ×2 IMPLANT
SUTURE MNCRL 4-0 27XMF (SUTURE) ×4 IMPLANT
SYR 30ML LL (SYRINGE) IMPLANT
SYSTEM WECK SHIELD CLOSURE (TROCAR) IMPLANT
TRAP FLUID SMOKE EVACUATOR (MISCELLANEOUS) ×3 IMPLANT
WATER STERILE IRR 500ML POUR (IV SOLUTION) ×3 IMPLANT

## 2022-05-29 NOTE — Interval H&P Note (Signed)
History and Physical Interval Note:  05/29/2022 12:39 PM  Charles Bell  has presented today for surgery, with the diagnosis of calculus of gallbladder w/o cholecystitis w/o obstruction K80.20.  The various methods of treatment have been discussed with the patient and family. After consideration of risks, benefits and other options for treatment, the patient has consented to  Procedure(s): XI ROBOTIC ASSISTED LAPAROSCOPIC CHOLECYSTECTOMY (N/A) as a surgical intervention.  The patient's history has been reviewed, patient examined, no change in status, stable for surgery.  I have reviewed the patient's chart and labs.  Questions were answered to the patient's satisfaction.     Charles Bell

## 2022-05-29 NOTE — Op Note (Signed)
Preoperative diagnosis:  biliary colic  Postoperative diagnosis: same as above  Procedure: Robotic assisted Laparoscopic Cholecystectomy.   Anesthesia: GETA   Surgeon: Sung Amabile  Specimen: Gallbladder  Complications: None  EBL: 15mL  Wound Classification: Clean Contaminated  Indications: see HPI  Findings: Critical view of safety noted Cystic duct and artery identified, ligated and divided, clips remained intact at end of procedure Adequate hemostasis  Description of procedure:  The patient was placed on the operating table in the supine position. SCDs placed, pre-op abx administered.  General anesthesia was induced and OG tube placed by anesthesia. A time-out was completed verifying correct patient, procedure, site, positioning, and implant(s) and/or special equipment prior to beginning this procedure. The abdomen was prepped and draped in the usual sterile fashion.    Veress needle was placed at the Palmer's point and insufflation was started after confirming a positive saline drop test and no immediate increase in abdominal pressure.  After reaching 15 mm, the Veress needle was removed and a 8 mm port was placed via optiview technique under umbilicus measured 20mm from gallbladder.  The abdomen was inspected and no abnormalities or injuries were found.  Under direct vision, ports were placed in the following locations: One 12 mm patient left of the umbilicus, 8cm from the optiviewed port, one 8 mm port placed to the patient right of the umbilical port 8 cm apart.  1 additional 8 mm port placed lateral to the 12mm port.  Once ports were placed, The table was placed in the reverse Trendelenburg position with the right side up. The Xi platform was brought into the operative field and docked to the ports successfully.  An endoscope was placed through the umbilical port, fenestrated grasper through the adjacent patient right port, prograsp to the far patient left port, and then a hook  cautery in the left port.  The dome of the gallbladder was grasped with prograsp, passed and retracted over the dome of the liver. Adhesions between the gallbladder and omentum, duodenum and transverse colon were lysed via hook cautery. The infundibulum was grasped with the fenestrated grasper and retracted toward the right lower quadrant. This maneuver exposed Calot's triangle. The peritoneum overlying the gallbladder infundibulum was then dissected  and the cystic duct and cystic artery identified.  Critical view of safety with the liver bed clearly visible behind the duct and artery with no additional structures noted.  The cystic duct and cystic artery clipped and divided close to the gallbladder.  Of note, artery seemed to be twisted around duct and the hepatic artery adhered to posterior aspect of infundibulum, requiring additional dissection to separate and clip structures.   The gallbladder was then dissected from its peritoneal and liver bed attachments by electrocautery. Hemostasis was checked prior to removing the hook cautery and the Endo Catch bag was then placed through the 12 mm port and the gallbladder was removed.  The gallbladder was passed off the table as a specimen. There was no evidence of bleeding from the gallbladder fossa or cystic artery or leakage of the bile from the cystic duct stump. The 12 mm port site closed with PMI using 0 vicryl under direct vision.  Abdomen desufflated and secondary trocars were removed under direct vision. No bleeding was noted. All skin incisions then closed with subcuticular sutures of 4-0 monocryl and dressed with topical skin adhesive. The orogastric tube was removed and patient extubated.  The patient tolerated the procedure well and was taken to the postanesthesia care unit  in stable condition.  All sponge and instrument count correct at end of procedure.

## 2022-05-29 NOTE — Transfer of Care (Signed)
Immediate Anesthesia Transfer of Care Note  Patient: Charles Bell  Procedure(s) Performed: XI ROBOTIC ASSISTED LAPAROSCOPIC CHOLECYSTECTOMY (Abdomen) INDOCYANINE GREEN FLUORESCENCE IMAGING (ICG)  Patient Location: PACU  Anesthesia Type:General  Level of Consciousness: drowsy  Airway & Oxygen Therapy: Patient Spontanous Breathing and Patient connected to face mask oxygen  Post-op Assessment: Report given to RN and Post -op Vital signs reviewed and stable  Post vital signs: Reviewed and stable  Last Vitals:  Vitals Value Taken Time  BP 146/82 05/29/22 1430  Temp 36.3 C 05/29/22 1430  Pulse 68 05/29/22 1431  Resp 15 05/29/22 1431  SpO2 100 % 05/29/22 1431  Vitals shown include unvalidated device data.  Last Pain:  Vitals:   05/29/22 1053  TempSrc: Temporal  PainSc: 0-No pain         Complications:  Encounter Notable Events  Notable Event Outcome Phase Comment  Difficult to intubate - unexpected  Intraprocedure Filed from anesthesia note documentation.

## 2022-05-29 NOTE — Discharge Instructions (Addendum)
Laparoscopic Cholecystectomy, Care After This sheet gives you information about how to care for yourself after your procedure. Your doctor may also give you more specific instructions. If you have problems or questions, contact your doctor. Follow these instructions at home: Care for cuts from surgery (incisions)  Follow instructions from your doctor about how to take care of your cuts from surgery. Make sure you: Wash your hands with soap and water before you change your bandage (dressing). If you cannot use soap and water, use hand sanitizer. Change your bandage as told by your doctor. Leave stitches (sutures), skin glue, or skin tape (adhesive) strips in place. They may need to stay in place for 2 weeks or longer. If tape strips get loose and curl up, you may trim the loose edges. Do not remove tape strips completely unless your doctor says it is okay. Do not take baths, swim, or use a hot tub until your doctor says it is okay. OK TO SHOWER 24HRS AFTER YOUR SURGERY.  Check your surgical cut area every day for signs of infection. Check for: More redness, swelling, or pain. More fluid or blood. Warmth. Pus or a bad smell. Activity Do not drive or use heavy machinery while taking prescription pain medicine. Do not play contact sports until your doctor says it is okay. Do not drive for 24 hours if you were given a medicine to help you relax (sedative). Rest as needed. Do not return to work or school until your doctor says it is okay. General instructions  tylenol and advil as needed for discomfort.  Please alternate between the two every four hours as needed for pain.    Use narcotics, if prescribed, only when tylenol and motrin is not enough to control pain.  325-650mg every 8hrs to max of 3000mg/24hrs (including the 325mg in every norco dose) for the tylenol.    Advil up to 800mg per dose every 8hrs as needed for pain.   To prevent or treat constipation while you are taking prescription  pain medicine, your doctor may recommend that you: Drink enough fluid to keep your pee (urine) clear or pale yellow. Take over-the-counter or prescription medicines. Eat foods that are high in fiber, such as fresh fruits and vegetables, whole grains, and beans. Limit foods that are high in fat and processed sugars, such as fried and sweet foods. Contact a doctor if: You develop a rash. You have more redness, swelling, or pain around your surgical cuts. You have more fluid or blood coming from your surgical cuts. Your surgical cuts feel warm to the touch. You have pus or a bad smell coming from your surgical cuts. You have a fever. One or more of your surgical cuts breaks open. You have trouble breathing. You have chest pain. You have pain that is getting worse in your shoulders. You faint or feel dizzy when you stand. You have very bad pain in your belly (abdomen). You are sick to your stomach (nauseous) for more than one day. You have throwing up (vomiting) that lasts for more than one day. You have leg pain. This information is not intended to replace advice given to you by your health care provider. Make sure you discuss any questions you have with your health care provider. Document Released: 10/22/2007 Document Revised: 08/03/2015 Document Reviewed: 07/01/2015 Elsevier Interactive Patient Education  2019 [employer]     AMBULATORY SURGERY  DISCHARGE INSTRUCTIONS  The drugs that you were given will stay in your system until tomorrow   so for the next 24 hours you should not:  Drive an automobile Make any legal decisions Drink any alcoholic beverage  You may resume regular meals tomorrow.  Today it is better to start with liquids and gradually work up to solid foods.  You may eat anything you prefer, but it is better to start with liquids, then soup and crackers, and gradually work up to solid foods.  Please notify your doctor immediately if you have any unusual bleeding,  trouble breathing, redness and pain at the surgery site, drainage, fever, or pain not relieved by medication.  Additional Instructions:  Please contact your physician with any problems or Same Day Surgery at [phone], Monday through Friday 6 am to 4 pm, or  at [facility] Main number at [phone]. 

## 2022-05-29 NOTE — Anesthesia Preprocedure Evaluation (Signed)
Anesthesia Evaluation  Patient identified by MRN, date of birth, ID band Patient awake    Reviewed: Allergy & Precautions, NPO status , Patient's Chart, lab work & pertinent test results  History of Anesthesia Complications Negative for: history of anesthetic complications  Airway Mallampati: III  TM Distance: >3 FB Neck ROM: full    Dental  (+) Chipped, Dental Advidsory Given   Pulmonary neg pulmonary ROS, neg shortness of breath, neg COPD   Pulmonary exam normal        Cardiovascular hypertension, negative cardio ROS Normal cardiovascular exam     Neuro/Psych negative neurological ROS  negative psych ROS   GI/Hepatic Neg liver ROS,GERD  Medicated,,  Endo/Other  negative endocrine ROSdiabetes    Renal/GU      Musculoskeletal   Abdominal   Peds  Hematology negative hematology ROS (+)   Anesthesia Other Findings Past Medical History: No date: Cholelithiasis No date: Diabetes mellitus without complication (HCC) No date: GERD (gastroesophageal reflux disease) No date: Hypertension No date: Obesity No date: Pure hypercholesterolemia No date: Vitamin D deficiency  Past Surgical History: No date: COLONOSCOPY No date: FINGER SURGERY; Right     Comment:  ring finger  BMI    Body Mass Index: 33.61 kg/m      Reproductive/Obstetrics negative OB ROS                             Anesthesia Physical Anesthesia Plan  ASA: 2  Anesthesia Plan: General ETT   Post-op Pain Management:    Induction: Intravenous  PONV Risk Score and Plan: 2 and Ondansetron, Dexamethasone and Midazolam  Airway Management Planned: Oral ETT  Additional Equipment:   Intra-op Plan:   Post-operative Plan: Extubation in OR  Informed Consent: I have reviewed the patients History and Physical, chart, labs and discussed the procedure including the risks, benefits and alternatives for the proposed anesthesia  with the patient or authorized representative who has indicated his/her understanding and acceptance.     Dental Advisory Given  Plan Discussed with: Anesthesiologist, CRNA and Surgeon  Anesthesia Plan Comments: (Patient consented for risks of anesthesia including but not limited to:  - adverse reactions to medications - damage to eyes, teeth, lips or other oral mucosa - nerve damage due to positioning  - sore throat or hoarseness - Damage to heart, brain, nerves, lungs, other parts of body or loss of life  Patient voiced understanding.)       Anesthesia Quick Evaluation

## 2022-05-29 NOTE — Anesthesia Procedure Notes (Signed)
Procedure Name: Intubation Date/Time: 05/29/2022 12:56 PM  Performed by: Allena Katz, Dhruthi, CRNAPre-anesthesia Checklist: Patient identified, Patient being monitored, Timeout performed, Emergency Drugs available and Suction available Patient Re-evaluated:Patient Re-evaluated prior to induction Oxygen Delivery Method: Circle system utilized Preoxygenation: Pre-oxygenation with 100% oxygen Induction Type: IV induction Ventilation: Mask ventilation without difficulty Laryngoscope Size: McGraph and 4 Grade View: Grade I Tube type: Oral Tube size: 7.5 mm Number of attempts: 1 Airway Equipment and Method: Stylet Placement Confirmation: ETT inserted through vocal cords under direct vision, positive ETCO2 and breath sounds checked- equal and bilateral Secured at: 23 cm Tube secured with: Tape Dental Injury: Teeth and Oropharynx as per pre-operative assessment  Difficulty Due To: Difficulty was unanticipated

## 2022-06-01 NOTE — Anesthesia Postprocedure Evaluation (Signed)
Anesthesia Post Note  Patient: Charles Bell  Procedure(s) Performed: XI ROBOTIC ASSISTED LAPAROSCOPIC CHOLECYSTECTOMY (Abdomen) INDOCYANINE GREEN FLUORESCENCE IMAGING (ICG)  Patient location during evaluation: PACU Anesthesia Type: General Level of consciousness: awake and alert Pain management: pain level controlled Vital Signs Assessment: post-procedure vital signs reviewed and stable Respiratory status: spontaneous breathing, nonlabored ventilation, respiratory function stable and patient connected to nasal cannula oxygen Cardiovascular status: blood pressure returned to baseline and stable Postop Assessment: no apparent nausea or vomiting Anesthetic complications: no Comments: Patient NOT a difficult airway    Encounter Notable Events  Notable Event Outcome Phase Comment  Difficult to intubate - unexpected  Intraprocedure Filed from anesthesia note documentation.     Last Vitals:  Vitals:   05/29/22 1554 05/29/22 1603  BP:  (!) 141/76  Pulse: 74 74  Resp: 17 18  Temp:  (!) 36.1 C  SpO2: 100% 99%    Last Pain:  Vitals:   05/29/22 1603  TempSrc: Temporal  PainSc: 3                  Louie Boston

## 2022-06-02 LAB — SURGICAL PATHOLOGY

## 2022-06-09 ENCOUNTER — Other Ambulatory Visit: Payer: Self-pay | Admitting: Family Medicine

## 2022-06-09 DIAGNOSIS — E7800 Pure hypercholesterolemia, unspecified: Secondary | ICD-10-CM

## 2022-06-09 DIAGNOSIS — Z9189 Other specified personal risk factors, not elsewhere classified: Secondary | ICD-10-CM

## 2022-06-09 DIAGNOSIS — I10 Essential (primary) hypertension: Secondary | ICD-10-CM

## 2023-07-12 ENCOUNTER — Emergency Department
Admission: EM | Admit: 2023-07-12 | Discharge: 2023-07-12 | Disposition: A | Discharge status: Home / Self Care | Attending: Emergency Medicine | Admitting: Emergency Medicine

## 2023-07-12 ENCOUNTER — Other Ambulatory Visit: Payer: Self-pay

## 2023-07-12 ENCOUNTER — Emergency Department

## 2023-07-12 DIAGNOSIS — I10 Essential (primary) hypertension: Secondary | ICD-10-CM | POA: Insufficient documentation

## 2023-07-12 DIAGNOSIS — E1165 Type 2 diabetes mellitus with hyperglycemia: Secondary | ICD-10-CM | POA: Insufficient documentation

## 2023-07-12 DIAGNOSIS — R109 Unspecified abdominal pain: Secondary | ICD-10-CM | POA: Diagnosis present

## 2023-07-12 DIAGNOSIS — Z7984 Long term (current) use of oral hypoglycemic drugs: Secondary | ICD-10-CM | POA: Diagnosis not present

## 2023-07-12 LAB — URINALYSIS, ROUTINE W REFLEX MICROSCOPIC
Bilirubin Urine: NEGATIVE
Glucose, UA: NEGATIVE mg/dL
Hgb urine dipstick: NEGATIVE
Ketones, ur: NEGATIVE mg/dL
Leukocytes,Ua: NEGATIVE
Nitrite: NEGATIVE
Protein, ur: NEGATIVE mg/dL
Specific Gravity, Urine: 1.018 (ref 1.005–1.030)
pH: 5 (ref 5.0–8.0)

## 2023-07-12 LAB — BASIC METABOLIC PANEL WITH GFR
Anion gap: 12 (ref 5–15)
BUN: 14 mg/dL (ref 6–20)
CO2: 23 mmol/L (ref 22–32)
Calcium: 9.1 mg/dL (ref 8.9–10.3)
Chloride: 104 mmol/L (ref 98–111)
Creatinine, Ser: 0.98 mg/dL (ref 0.61–1.24)
GFR, Estimated: >60 mL/min (ref >60)
Glucose, Bld: 201 mg/dL — ABNORMAL HIGH (ref 70–99)
Potassium: 4.1 mmol/L (ref 3.5–5.1)
Sodium: 139 mmol/L (ref 135–145)

## 2023-07-12 LAB — CBC
HCT: 42.5 % (ref 39.0–52.0)
Hemoglobin: 14.3 g/dL (ref 13.0–17.0)
MCH: 28.6 pg (ref 26.0–34.0)
MCHC: 33.6 g/dL (ref 30.0–36.0)
MCV: 85 fL (ref 80.0–100.0)
Platelets: 220 10*3/uL (ref 150–400)
RBC: 5 MIL/uL (ref 4.22–5.81)
RDW: 13.4 % (ref 11.5–15.5)
WBC: 5.3 10*3/uL (ref 4.0–10.5)
nRBC: 0 % (ref 0.0–0.2)

## 2023-07-12 MED ORDER — METHOCARBAMOL 500 MG PO TABS: 500.0000 mg | ORAL_TABLET | Freq: Three times a day (TID) | ORAL | 0 refills | Status: AC | PRN

## 2023-07-12 NOTE — Discharge Instructions (Signed)
 Your workup today is reassuring. I have prescribed a muscle relaxer for you. Be aware it may make you sleepy or dizzy.  Do not drive or operate machinery for 8 hours after your last dose.  See primary care if not improving.  Return to the ER for symptoms that change or worsen if unable to schedule an appointment.

## 2023-07-12 NOTE — ED Provider Notes (Signed)
 [facility] Provider Note    Event Date/Time   First MD Initiated Contact with Patient 07/12/23 1024     (approximate)   History   Flank Pain   HPI  Charles Bell is a 53 y.o. male  with history of DM, HTN, and as listed in EMR presents to the emergency department for treatment and evaluation of right flank pain x 3 days. Pain is sharp and shoots around his right side. No noted hematuria. No fever, nausea, vomiting, diarrhea, or constipation. Pain is not reproducible. Movement does not make it better or worse. No relief with ibuprofen.      Physical Exam   Triage Vital Signs:   Most recent vital signs: Vitals:   07/12/23 1120 07/12/23 1316  BP: (!) 145/91 (!) 146/90  Pulse: (!) 56 60  Resp: 18 18  Temp:    SpO2: 100% 100%    General: Awake, no distress.  CV:  Good peripheral perfusion.  Resp:  Normal effort.  Abd:  No distention.  Other:     ED Results / Procedures / Treatments   Labs (all labs ordered are listed, but only abnormal results are displayed) Labs Reviewed  URINALYSIS, ROUTINE W REFLEX MICROSCOPIC - Abnormal; Notable for the following components:      Result Value   Color, Urine YELLOW (*)    APPearance CLEAR (*)    All other components within normal limits  BASIC METABOLIC PANEL WITH GFR - Abnormal; Notable for the following components:   Glucose, Bld 201 (*)    All other components within normal limits  CBC     EKG  Not indicated.   RADIOLOGY  Image and radiology report reviewed and interpreted by me. Radiology report consistent with the same.  CT for renal stone negative for acute concerns.  PROCEDURES:  Critical Care performed: No  Procedures   MEDICATIONS ORDERED IN ED:  Medications - No data to display   IMPRESSION / MDM / ASSESSMENT AND PLAN / ED COURSE   I have reviewed the triage note.  Differential diagnosis includes, but is not limited to, kidney stone, muscle strain,  pyelonephritis, choledocholithiasis  Patient's presentation is most consistent with acute illness / injury with system symptoms.  53 year old male presenting to the emergency department for treatment and evaluation of right flank pain.  No known injury.  He has not had this pain in the past.  Triage vital signs indicate mild hypertension however patient is asymptomatic.  Labs are overall reassuring.  He does have a mild hyperglycemia with a random glucose of 201 with a history of diabetes on metformin.  Urinalysis is clear.  CT renal stone study is negative for acute findings.  All results were discussed with the patient.  Plan will be to treat him with Robaxin  for a likely musculoskeletal source of his current pain.  He was instructed to follow-up with his primary care provider if not improving over the next few days.  If symptoms change or worsen and he is unable to schedule appointment, he is return to the emergency department.      FINAL CLINICAL IMPRESSION(S) / ED DIAGNOSES   Final diagnoses:  Flank pain     Rx / DC Orders   ED Discharge Orders          Ordered    methocarbamol  (ROBAXIN ) 500 MG tablet  Every 8 hours PRN        07/12/23 1300  Note:  This document was prepared using Dragon voice recognition software and may include unintentional dictation errors.   Sherryle Don, FNP 07/12/23 1344    Bryson Carbine, MD 07/12/23 1349

## 2023-07-12 NOTE — ED Triage Notes (Signed)
 Pt comes with three days of right flank pain. Pt denies any urinary symptoms. Pt stats no N/V/D

## 2023-07-12 NOTE — ED Notes (Signed)
 Pt is CAOx4, breathing normally, and normal in color. Pt is sitting upright in a chair and complaining of right sided flank pain x3 days. Pt in NAD at this time. PT denies any needs.

## 2023-11-10 ENCOUNTER — Encounter: Payer: Self-pay | Admitting: Gastroenterology

## 2023-11-15 ENCOUNTER — Encounter: Payer: Self-pay | Admitting: Gastroenterology

## 2023-11-21 NOTE — H&P (Incomplete)
 Pre-Procedure H&P   Patient ID: Charles Bell is a 53 y.o. male.  Gastroenterology Provider: Elspeth Ozell Jungling, DO  Referring Provider: Dr. Alla PCP: Alla Amis, MD  Date: 11/22/2023  HPI Mr. Charles Bell is a 53 y.o. male who presents today for Colonoscopy for Colorectal cancer screening underwent attempt for colonoscopy in February 2023 with poor prep..  An inflammatory polyp was removed in the sigmoid colon.  A transverse colon lipoma was also appreciated.  He reports every other day bowel movement.  No melena or hematochezia Ozempic has been held for the procedure (taken 1 week ago) Hemoglobin 13.7 MCV 85 platelets 256,000 Status post cholecystectomy   Past Medical History:  Diagnosis Date   Cholelithiasis    Diabetes mellitus without complication (HCC)    GERD (gastroesophageal reflux disease)    Hypertension    Obesity    Obesity (BMI 30-39.9)    Pure hypercholesterolemia    Vitamin D deficiency     Past Surgical History:  Procedure Laterality Date   CHOLECYSTECTOMY     COLONOSCOPY     FINGER SURGERY Right    ring finger   pure hypercholesterolemia      Family History No h/o GI disease or malignancy  Review of Systems  Constitutional:  Negative for activity change, appetite change, chills, diaphoresis, fatigue, fever and unexpected weight change.  HENT:  Negative for trouble swallowing and voice change.   Respiratory:  Negative for shortness of breath and wheezing.   Cardiovascular:  Negative for chest pain, palpitations and leg swelling.  Gastrointestinal:  Negative for abdominal distention, abdominal pain, anal bleeding, blood in stool, constipation, diarrhea, nausea and vomiting.  Musculoskeletal:  Negative for arthralgias and myalgias.  Skin:  Negative for color change and pallor.  Neurological:  Negative for dizziness, syncope and weakness.  Psychiatric/Behavioral:  Negative for confusion. The patient is not  nervous/anxious.   All other systems reviewed and are negative.    Medications No current facility-administered medications on file prior to encounter.   Current Outpatient Medications on File Prior to Encounter  Medication Sig Dispense Refill   aspirin EC 81 MG tablet Take 81 mg by mouth daily. Swallow whole.     atorvastatin (LIPITOR) 80 MG tablet Take 80 mg by mouth daily.     diphenhydrAMINE (BENADRYL) 25 MG tablet Take 100 mg by mouth daily.     famotidine  (PEPCID ) 20 MG tablet Take 20 mg by mouth as needed.     fexofenadine -pseudoephedrine (ALLEGRA-D ALLERGY & CONGESTION) 180-240 MG 24 hr tablet Take 1 tablet by mouth as needed.     fluticasone  (FLONASE ) 50 MCG/ACT nasal spray Place 2 sprays into both nostrils as needed for allergies.     hydrochlorothiazide (HYDRODIURIL) 12.5 MG tablet Take 12.5 mg by mouth as needed.     losartan (COZAAR) 50 MG tablet Take 50 mg by mouth daily.     meloxicam  (MOBIC ) 15 MG tablet Take 1 tablet (15 mg total) by mouth as needed.     metFORMIN (GLUCOPHAGE) 1000 MG tablet Take 1,000 mg by mouth 2 (two) times daily with a meal.     methocarbamol  (ROBAXIN ) 500 MG tablet Take 1 tablet (500 mg total) by mouth every 8 (eight) hours as needed. 30 tablet 0   rosuvastatin (CRESTOR) 40 MG tablet Take 40 mg by mouth daily.     Semaglutide,0.25 or 0.5MG /DOS, (OZEMPIC, 0.25 OR 0.5 MG/DOSE,) 2 MG/3ML SOPN Inject into the skin.     traMADol  (ULTRAM ) 50  MG tablet Take 1 tablet (50 mg total) by mouth every 6 (six) hours as needed for up to 6 doses. 6 tablet 0   valACYclovir (VALTREX) 1000 MG tablet Take 1,000 mg by mouth daily as needed.  6   acetaminophen  (TYLENOL ) 500 MG tablet Take 1,000 mg by mouth every 6 (six) hours as needed for mild pain.     ondansetron  (ZOFRAN -ODT) 4 MG disintegrating tablet Take 1 tablet (4 mg total) by mouth every 8 (eight) hours as needed for nausea or vomiting. 30 tablet 0    Pertinent medications related to GI and procedure were  reviewed by me with the patient prior to the procedure  No current facility-administered medications for this encounter.      No Known Allergies Allergies were reviewed by me prior to the procedure  Objective   Body mass index is 37.77 kg/m. Vitals:   11/22/23 0923  BP: (!) 141/76  Pulse: 67  Resp: 18  Temp: (!) 97 F (36.1 C)  TempSrc: Temporal  SpO2: 100%  Weight: 112.7 kg  Height: 5' 8 (1.727 m)     Physical Exam Vitals and nursing note reviewed.  Constitutional:      General: He is not in acute distress.    Appearance: Normal appearance. He is obese. He is not ill-appearing, toxic-appearing or diaphoretic.  HENT:     Head: Normocephalic and atraumatic.     Nose: Nose normal.     Mouth/Throat:     Mouth: Mucous membranes are moist.     Pharynx: Oropharynx is clear.  Eyes:     General: No scleral icterus.    Extraocular Movements: Extraocular movements intact.  Cardiovascular:     Rate and Rhythm: Normal rate and regular rhythm.     Heart sounds: Normal heart sounds. No murmur heard.    No friction rub. No gallop.  Pulmonary:     Effort: Pulmonary effort is normal. No respiratory distress.     Breath sounds: Normal breath sounds. No wheezing, rhonchi or rales.  Abdominal:     General: Bowel sounds are normal. There is no distension.     Palpations: Abdomen is soft.     Tenderness: There is no abdominal tenderness. There is no guarding or rebound.  Musculoskeletal:     Cervical back: Neck supple.     Right lower leg: No edema.     Left lower leg: No edema.  Skin:    General: Skin is warm and dry.     Coloration: Skin is not jaundiced or pale.  Neurological:     General: No focal deficit present.     Mental Status: He is alert and oriented to person, place, and time. Mental status is at baseline.  Psychiatric:        Mood and Affect: Mood normal.        Behavior: Behavior normal.        Thought Content: Thought content normal.        Judgment:  Judgment normal.      Assessment:  Mr. Charles Bell is a 53 y.o. male  who presents today for Colonoscopy for Colorectal cancer screening .  Plan:  Colonoscopy with possible intervention today  Colonoscopy with possible biopsy, control of bleeding, polypectomy, and interventions as necessary has been discussed with the patient/patient representative. Informed consent was obtained from the patient/patient representative after explaining the indication, nature, and risks of the procedure including but not limited to death, bleeding, perforation, missed neoplasm/lesions, cardiorespiratory  compromise, and reaction to medications. Opportunity for questions was given and appropriate answers were provided. Patient/patient representative has verbalized understanding is amenable to undergoing the procedure.   Elspeth Ozell Jungling, DO  [facility]astro[facility]  Portions of the record may have been created with voice recognition software. Occasional wrong-word or 'sound-a-like' substitutions may have occurred due to the inherent limitations of voice recognition software.  Read the chart carefully and recognize, using context, where substitutions may have occurred.

## 2023-11-22 ENCOUNTER — Encounter: Payer: Self-pay | Admitting: Gastroenterology

## 2023-11-22 ENCOUNTER — Ambulatory Visit: Admitting: General Practice

## 2023-11-22 ENCOUNTER — Ambulatory Visit
Admission: RE | Admit: 2023-11-22 | Discharge: 2023-11-22 | Disposition: A | Discharge status: Home / Self Care | Attending: Gastroenterology | Admitting: Gastroenterology

## 2023-11-22 ENCOUNTER — Encounter
Admission: RE | Disposition: A | Discharge status: Home / Self Care | Payer: Self-pay | Source: Home / Self Care | Attending: Gastroenterology

## 2023-11-22 DIAGNOSIS — K219 Gastro-esophageal reflux disease without esophagitis: Secondary | ICD-10-CM | POA: Insufficient documentation

## 2023-11-22 DIAGNOSIS — I10 Essential (primary) hypertension: Secondary | ICD-10-CM | POA: Insufficient documentation

## 2023-11-22 DIAGNOSIS — Z7984 Long term (current) use of oral hypoglycemic drugs: Secondary | ICD-10-CM | POA: Diagnosis not present

## 2023-11-22 DIAGNOSIS — Z9049 Acquired absence of other specified parts of digestive tract: Secondary | ICD-10-CM | POA: Diagnosis not present

## 2023-11-22 DIAGNOSIS — E119 Type 2 diabetes mellitus without complications: Secondary | ICD-10-CM | POA: Diagnosis not present

## 2023-11-22 DIAGNOSIS — K635 Polyp of colon: Secondary | ICD-10-CM | POA: Insufficient documentation

## 2023-11-22 DIAGNOSIS — Z09 Encounter for follow-up examination after completed treatment for conditions other than malignant neoplasm: Secondary | ICD-10-CM | POA: Diagnosis present

## 2023-11-22 DIAGNOSIS — Z7985 Long-term (current) use of injectable non-insulin antidiabetic drugs: Secondary | ICD-10-CM | POA: Insufficient documentation

## 2023-11-22 HISTORY — DX: Obesity, unspecified: E66.9

## 2023-11-22 LAB — GLUCOSE, CAPILLARY: Glucose-Capillary: 110 mg/dL — ABNORMAL HIGH (ref 70–99)

## 2023-11-22 SURGERY — COLONOSCOPY
Anesthesia: General

## 2023-11-22 MED ORDER — LIDOCAINE HCL (CARDIAC) PF 100 MG/5ML IV SOSY
PREFILLED_SYRINGE | INTRAVENOUS | Status: DC | PRN
  Administered 2023-11-22: 100 mg via INTRAVENOUS

## 2023-11-22 MED ORDER — PROPOFOL 500 MG/50ML IV EMUL
INTRAVENOUS | Status: DC | PRN
  Administered 2023-11-22: 150 ug/kg/min via INTRAVENOUS
  Administered 2023-11-22: 50 mg via INTRAVENOUS

## 2023-11-22 MED ORDER — SODIUM CHLORIDE 0.9 % IV SOLN: INTRAVENOUS | Status: DC | PRN

## 2023-11-22 MED ORDER — SODIUM CHLORIDE 0.9 % IV SOLN: INTRAVENOUS | Status: DC

## 2023-11-22 MED ORDER — DEXMEDETOMIDINE HCL IN NACL 80 MCG/20ML IV SOLN
INTRAVENOUS | Status: DC | PRN
  Administered 2023-11-22: 12 ug via INTRAVENOUS

## 2023-11-22 NOTE — Op Note (Signed)
 [facility] Gastroenterology Patient Name: Charles Bell Procedure Date: 11/22/2023 9:27 AM MRN: [id number] Account #: [id number] Date of Birth: 12-24-1970 Admit Type: Outpatient Age: 53 Room: [facility] ENDO ROOM 2 Gender: Male Note Status: Finalized Instrument Name: Colon Scope [id number] Procedure:             Colonoscopy Indications:           Screening for colorectal malignant neoplasm Providers:             Elspeth Ozell Jungling DO, DO Referring MD:          Alda Carpen (Referring MD) Medicines:             Monitored Anesthesia Care Complications:         No immediate complications. Estimated blood loss:                         Minimal. Procedure:             Pre-Anesthesia Assessment:                        - Prior to the procedure, a History and Physical was                         performed, and patient medications and allergies were                         reviewed. The patient is competent. The risks and                         benefits of the procedure and the sedation options and                         risks were discussed with the patient. All questions                         were answered and informed consent was obtained.                         Patient identification and proposed procedure were                         verified by the physician, the nurse, the anesthetist                         and the technician in the endoscopy suite. Mental                         Status Examination: alert and oriented. Airway                         Examination: normal oropharyngeal airway and neck                         mobility. Respiratory Examination: clear to                         auscultation. CV Examination: RRR, no murmurs, no S3  or S4. Prophylactic Antibiotics: The patient does not                         require prophylactic antibiotics. Prior                         Anticoagulants: The patient has taken no anticoagulant                          or antiplatelet agents. ASA Grade Assessment: II - A                         patient with mild systemic disease. After reviewing                         the risks and benefits, the patient was deemed in                         satisfactory condition to undergo the procedure. The                         anesthesia plan was to use monitored anesthesia care                         (MAC). Immediately prior to administration of                         medications, the patient was re-assessed for adequacy                         to receive sedatives. The heart rate, respiratory                         rate, oxygen saturations, blood pressure, adequacy of                         pulmonary ventilation, and response to care were                         monitored throughout the procedure. The physical                         status of the patient was re-assessed after the                         procedure.                        After obtaining informed consent, the colonoscope was                         passed under direct vision. Throughout the procedure,                         the patient's blood pressure, pulse, and oxygen                         saturations were monitored continuously. The  Colonoscope was introduced through the anus and                         advanced to the the terminal ileum, with                         identification of the appendiceal orifice and IC                         valve. The colonoscopy was performed without                         difficulty. The patient tolerated the procedure well.                         The quality of the bowel preparation was evaluated                         using the BBPS [facility] Bowel Preparation Scale) with                         scores of: Right Colon = 3 (entire mucosa seen well                         with no residual staining, small fragments of stool or                          opaque liquid), Transverse Colon = 3 (entire mucosa                         seen well with no residual staining, small fragments                         of stool or opaque liquid) and Left Colon = 2 (minor                         amount of residual staining, small fragments of stool                         and/or opaque liquid, but mucosa seen well). The total                         BBPS score equals 8. The quality of the bowel                         preparation was excellent. The terminal ileum,                         ileocecal valve, appendiceal orifice, and rectum were                         photographed. Findings:      The perianal and digital rectal examinations were normal. Pertinent       negatives include normal sphincter tone.      The terminal ileum appeared normal. Estimated blood loss: none.      Retroflexion in the right colon was performed.  A 1 to 2 mm polyp was found in the ascending colon. The polyp was       sessile. The polyp was removed with a jumbo cold forceps. Resection and       retrieval were complete. Estimated blood loss was minimal.      The exam was otherwise without abnormality on direct and retroflexion       views. Impression:            - The examined portion of the ileum was normal.                        - One 1 to 2 mm polyp in the ascending colon, removed                         with a jumbo cold forceps. Resected and retrieved.                        - The examination was otherwise normal on direct and                         retroflexion views. Recommendation:        - Patient has a contact number available for                         emergencies. The signs and symptoms of potential                         delayed complications were discussed with the patient.                         Return to normal activities tomorrow. Written                         discharge instructions were provided to the patient.                        - Discharge  patient to home.                        - Resume previous diet.                        - Continue present medications.                        - Await pathology results.                        - Repeat colonoscopy for surveillance based on                         pathology results.                        - Return to referring physician as previously                         scheduled.                        - The  findings and recommendations were discussed with                         the patient. Procedure Code(s):     --- Professional ---                        [phone], Colonoscopy, flexible; with biopsy, single or                         multiple Diagnosis Code(s):     --- Professional ---                        Z12.11, Encounter for screening for malignant neoplasm                         of colon                        D12.2, Benign neoplasm of ascending colon CPT copyright 2022 American [employer] Association. All rights reserved. The codes documented in this report are preliminary and upon coder review may  be revised to meet current compliance requirements. Attending Participation:      I personally performed the entire procedure. Elspeth Jungling, DO Elspeth Ozell Jungling DO, DO 11/22/2023 10:02:59 AM This report has been signed electronically. Number of Addenda: 0 Note Initiated On: 11/22/2023 9:27 AM Scope Withdrawal Time: 0 hours 11 minutes 26 seconds  Total Procedure Duration: 0 hours 14 minutes 50 seconds  Estimated Blood Loss:  Estimated blood loss was minimal.      [facility]

## 2023-11-22 NOTE — Anesthesia Postprocedure Evaluation (Signed)
 Anesthesia Post Note  Patient: Charles Bell  Procedure(s) Performed: COLONOSCOPY POLYPECTOMY, INTESTINE  Patient location during evaluation: Endoscopy Anesthesia Type: General Level of consciousness: awake and alert Pain management: pain level controlled Vital Signs Assessment: post-procedure vital signs reviewed and stable Respiratory status: spontaneous breathing, nonlabored ventilation, respiratory function stable and patient connected to nasal cannula oxygen Cardiovascular status: blood pressure returned to baseline and stable Postop Assessment: no apparent nausea or vomiting Anesthetic complications: no   There were no known notable events for this encounter.   Last Vitals:  Vitals:   11/22/23 1010 11/22/23 1020  BP: 115/70 117/73  Pulse: 69 63  Resp: 17 14  Temp:    SpO2: 100% 99%    Last Pain:  Vitals:   11/22/23 1020  TempSrc:   PainSc: 0-No pain                 Debby [place]

## 2023-11-22 NOTE — Transfer of Care (Signed)
 Immediate Anesthesia Transfer of Care Note  Patient: Charles Bell  Procedure(s) Performed: COLONOSCOPY POLYPECTOMY, INTESTINE  Patient Location: PACU  Anesthesia Type:General  Level of Consciousness: drowsy and patient cooperative  Airway & Oxygen Therapy: Patient Spontanous Breathing and Patient connected to nasal cannula oxygen  Post-op Assessment: Report given to RN and Post -op Vital signs reviewed and stable  Post vital signs: stable  Last Vitals:  Vitals Value Taken Time  BP 103/61 11/22/23 10:00  Temp 36.1 C 11/22/23 10:00  Pulse 69 11/22/23 10:01  Resp 17 11/22/23 10:01  SpO2 99 % 11/22/23 10:01  Vitals shown include unfiled device data.  Last Pain:  Vitals:   11/22/23 1000  TempSrc: Tympanic  PainSc: Asleep         Complications: No notable events documented.

## 2023-11-22 NOTE — Anesthesia Preprocedure Evaluation (Signed)
 Anesthesia Evaluation  Patient identified by MRN, date of birth, ID band Patient awake    Reviewed: Allergy & Precautions, NPO status , Patient's Chart, lab work & pertinent test results  History of Anesthesia Complications Negative for: history of anesthetic complications  Airway Mallampati: III  TM Distance: >3 FB Neck ROM: full    Dental  (+) Chipped, Dental Advidsory Given   Pulmonary neg pulmonary ROS, neg shortness of breath, neg COPD   Pulmonary exam normal        Cardiovascular hypertension, negative cardio ROS Normal cardiovascular exam     Neuro/Psych negative neurological ROS  negative psych ROS   GI/Hepatic Neg liver ROS,GERD  Medicated,,  Endo/Other  negative endocrine ROSdiabetes    Renal/GU      Musculoskeletal   Abdominal   Peds  Hematology negative hematology ROS (+)   Anesthesia Other Findings Past Medical History: No date: Cholelithiasis No date: Diabetes mellitus without complication (HCC) No date: GERD (gastroesophageal reflux disease) No date: Hypertension No date: Obesity No date: Pure hypercholesterolemia No date: Vitamin D deficiency  Past Surgical History: No date: COLONOSCOPY No date: FINGER SURGERY; Right     Comment:  ring finger  BMI    Body Mass Index: 33.61 kg/m      Reproductive/Obstetrics negative OB ROS                              Anesthesia Physical Anesthesia Plan  ASA: 2  Anesthesia Plan: General   Post-op Pain Management: Minimal or no pain anticipated   Induction: Intravenous  PONV Risk Score and Plan: 2 and Propofol  infusion and TIVA  Airway Management Planned: Nasal Cannula  Additional Equipment: None  Intra-op Plan:   Post-operative Plan:   Informed Consent: I have reviewed the patients History and Physical, chart, labs and discussed the procedure including the risks, benefits and alternatives for the proposed  anesthesia with the patient or authorized representative who has indicated his/her understanding and acceptance.     Dental advisory given  Plan Discussed with: CRNA and Surgeon  Anesthesia Plan Comments: (Discussed risks of anesthesia with patient, including possibility of difficulty with spontaneous ventilation under anesthesia necessitating airway intervention, PONV, and rare risks such as cardiac or respiratory or neurological events, and allergic reactions. Discussed the role of CRNA in patient's perioperative care. Patient understands.)        Anesthesia Quick Evaluation

## 2023-11-22 NOTE — Interval H&P Note (Signed)
 History and Physical Interval Note: Preprocedure H&P from 11/22/23  was reviewed and there was no interval change after seeing and examining the patient.  Written consent was obtained from the patient after discussion of risks, benefits, and alternatives. Patient has consented to proceed with Colonoscopy with possible intervention   11/22/2023 9:33 AM  Charles Bell  has presented today for surgery, with the diagnosis of Screening for colon cancer.  The various methods of treatment have been discussed with the patient and family. After consideration of risks, benefits and other options for treatment, the patient has consented to  Procedure(s): COLONOSCOPY (N/A) as a surgical intervention.  The patient's history has been reviewed, patient examined, no change in status, stable for surgery.  I have reviewed the patient's chart and labs.  Questions were answered to the patient's satisfaction.     Elspeth Ozell Jungling

## 2023-11-23 LAB — SURGICAL PATHOLOGY
